# Patient Record
Sex: Male | Born: 1955 | Race: White | Hispanic: No | Smoking: Never smoker
Health system: Southern US, Community
[De-identification: ages and names within clinical notes are randomized; demographics above are authoritative.]

## PROBLEM LIST (undated history)

## (undated) DIAGNOSIS — F329 Major depressive disorder, single episode, unspecified: Secondary | ICD-10-CM

## (undated) DIAGNOSIS — K5730 Diverticulosis of large intestine without perforation or abscess without bleeding: Secondary | ICD-10-CM

## (undated) DIAGNOSIS — M545 Low back pain: Secondary | ICD-10-CM

## (undated) DIAGNOSIS — E785 Hyperlipidemia, unspecified: Secondary | ICD-10-CM

## (undated) HISTORY — DX: Low back pain: M54.5

## (undated) HISTORY — DX: Diverticulosis of large intestine without perforation or abscess without bleeding: K57.30

## (undated) HISTORY — DX: Hyperlipidemia, unspecified: E78.5

## (undated) HISTORY — DX: Major depressive disorder, single episode, unspecified: F32.9

---

## 1995-09-05 DIAGNOSIS — M545 Low back pain, unspecified: Secondary | ICD-10-CM

## 1995-09-05 HISTORY — PX: LUMBAR DISC SURGERY: SHX700

## 1995-09-05 HISTORY — DX: Low back pain, unspecified: M54.50

## 2004-09-20 ENCOUNTER — Ambulatory Visit: Payer: Self-pay | Admitting: Internal Medicine

## 2004-10-18 ENCOUNTER — Ambulatory Visit: Payer: Self-pay | Admitting: Internal Medicine

## 2005-02-16 ENCOUNTER — Ambulatory Visit: Payer: Self-pay | Admitting: Internal Medicine

## 2006-01-04 ENCOUNTER — Ambulatory Visit: Payer: Self-pay | Admitting: Internal Medicine

## 2006-02-07 ENCOUNTER — Ambulatory Visit: Payer: Self-pay | Admitting: Internal Medicine

## 2006-06-22 ENCOUNTER — Ambulatory Visit: Payer: Self-pay | Admitting: Internal Medicine

## 2006-06-22 LAB — CONVERTED CEMR LAB
Albumin: 4.1 g/dL (ref 3.5–5.2)
Alkaline Phosphatase: 65 units/L (ref 39–117)
Basophils Absolute: 0 10*3/uL (ref 0.0–0.1)
Chol/HDL Ratio, serum: 5.2
Cholesterol: 229 mg/dL (ref 0–200)
Creatinine, Ser: 0.9 mg/dL (ref 0.4–1.5)
GFR calc non Af Amer: 95 mL/min
HDL: 43.7 mg/dL (ref >39.0)
Hemoglobin: 16.1 g/dL (ref 13.0–17.0)
LDL DIRECT: 129.9 mg/dL
MCHC: 33.8 g/dL (ref 30.0–36.0)
MCV: 92.3 fL (ref 78.0–100.0)
Monocytes Relative: 4.1 % (ref 3.0–11.0)
Neutro Abs: 4.5 10*3/uL (ref 1.4–7.7)
Neutrophils Relative %: 68 % (ref 43.0–77.0)
PSA: 5.07 ng/mL — ABNORMAL HIGH (ref 0.10–4.00)
Potassium: 4.2 meq/L (ref 3.5–5.1)
RBC: 5.16 M/uL (ref 4.22–5.81)
RDW: 11.9 % (ref 11.5–14.6)
TSH: 1.16 microintl units/mL (ref 0.35–5.50)
VLDL: 65 mg/dL — ABNORMAL HIGH (ref 0–40)

## 2006-06-29 ENCOUNTER — Ambulatory Visit: Payer: Self-pay | Admitting: Internal Medicine

## 2006-08-30 ENCOUNTER — Ambulatory Visit: Payer: Self-pay | Admitting: Gastroenterology

## 2006-09-13 ENCOUNTER — Ambulatory Visit: Payer: Self-pay | Admitting: Internal Medicine

## 2006-10-30 ENCOUNTER — Ambulatory Visit: Payer: Self-pay | Admitting: Internal Medicine

## 2006-10-30 LAB — CONVERTED CEMR LAB
Cholesterol: 239 mg/dL (ref 0–200)
HDL: 44.3 mg/dL (ref >39.0)
PSA: 1.91 ng/mL (ref 0.10–4.00)
Triglycerides: 193 mg/dL — ABNORMAL HIGH (ref 0–149)

## 2006-12-10 ENCOUNTER — Ambulatory Visit: Payer: Self-pay | Admitting: Gastroenterology

## 2006-12-21 ENCOUNTER — Ambulatory Visit: Payer: Self-pay | Admitting: Gastroenterology

## 2006-12-21 ENCOUNTER — Encounter (INDEPENDENT_AMBULATORY_CARE_PROVIDER_SITE_OTHER): Payer: Self-pay | Admitting: Specialist

## 2007-02-18 ENCOUNTER — Ambulatory Visit: Payer: Self-pay | Admitting: Internal Medicine

## 2007-06-13 ENCOUNTER — Emergency Department (HOSPITAL_COMMUNITY): Admission: EM | Admit: 2007-06-13 | Discharge: 2007-06-13 | Payer: Self-pay | Admitting: Emergency Medicine

## 2007-07-01 ENCOUNTER — Ambulatory Visit: Payer: Self-pay | Admitting: Internal Medicine

## 2007-07-01 LAB — CONVERTED CEMR LAB
Blood in Urine, dipstick: NEGATIVE
Glucose, Urine, Semiquant: NEGATIVE
Protein, U semiquant: NEGATIVE
WBC Urine, dipstick: NEGATIVE

## 2007-07-08 DIAGNOSIS — K5730 Diverticulosis of large intestine without perforation or abscess without bleeding: Secondary | ICD-10-CM | POA: Insufficient documentation

## 2007-07-08 DIAGNOSIS — E785 Hyperlipidemia, unspecified: Secondary | ICD-10-CM | POA: Insufficient documentation

## 2007-07-08 DIAGNOSIS — M51379 Other intervertebral disc degeneration, lumbosacral region without mention of lumbar back pain or lower extremity pain: Secondary | ICD-10-CM | POA: Insufficient documentation

## 2007-07-08 DIAGNOSIS — M5137 Other intervertebral disc degeneration, lumbosacral region: Secondary | ICD-10-CM

## 2007-07-08 DIAGNOSIS — F329 Major depressive disorder, single episode, unspecified: Secondary | ICD-10-CM

## 2007-07-08 DIAGNOSIS — F32A Depression, unspecified: Secondary | ICD-10-CM | POA: Insufficient documentation

## 2007-07-08 DIAGNOSIS — K644 Residual hemorrhoidal skin tags: Secondary | ICD-10-CM | POA: Insufficient documentation

## 2007-07-08 LAB — CONVERTED CEMR LAB
ALT: 16 units/L (ref 0–53)
AST: 16 units/L (ref 0–37)
Basophils Absolute: 0 10*3/uL (ref 0.0–0.1)
Basophils Relative: 0.6 % (ref 0.0–1.0)
CO2: 28 meq/L (ref 19–32)
Calcium: 9.5 mg/dL (ref 8.4–10.5)
Chloride: 107 meq/L (ref 96–112)
Cholesterol: 212 mg/dL (ref 0–200)
Eosinophils Absolute: 0.1 10*3/uL (ref 0.0–0.6)
GFR calc Af Amer: 102 mL/min
Glucose, Bld: 105 mg/dL — ABNORMAL HIGH (ref 70–99)
HCT: 42 % (ref 39.0–52.0)
Hemoglobin: 15 g/dL (ref 13.0–17.0)
Lymphocytes Relative: 24.9 % (ref 12.0–46.0)
MCHC: 35.7 g/dL (ref 30.0–36.0)
Monocytes Absolute: 0.4 10*3/uL (ref 0.2–0.7)
Monocytes Relative: 4.9 % (ref 3.0–11.0)
Platelets: 240 10*3/uL (ref 150–400)
Sodium: 142 meq/L (ref 135–145)
Total Bilirubin: 0.5 mg/dL (ref 0.3–1.2)
Total CHOL/HDL Ratio: 4.8
VLDL: 29 mg/dL (ref 0–40)

## 2007-07-24 ENCOUNTER — Telehealth (INDEPENDENT_AMBULATORY_CARE_PROVIDER_SITE_OTHER): Payer: Self-pay | Admitting: *Deleted

## 2007-08-26 ENCOUNTER — Ambulatory Visit: Payer: Self-pay | Admitting: Internal Medicine

## 2008-01-03 ENCOUNTER — Emergency Department (HOSPITAL_COMMUNITY): Admission: EM | Admit: 2008-01-03 | Discharge: 2008-01-03 | Payer: Self-pay | Admitting: Emergency Medicine

## 2009-03-24 ENCOUNTER — Telehealth: Payer: Self-pay | Admitting: Internal Medicine

## 2009-04-28 ENCOUNTER — Ambulatory Visit: Payer: Self-pay | Admitting: Internal Medicine

## 2009-04-28 LAB — CONVERTED CEMR LAB
BUN: 8 mg/dL (ref 6–23)
CO2: 29 meq/L (ref 19–32)
Chloride: 107 meq/L (ref 96–112)
Creatinine, Ser: 1 mg/dL (ref 0.4–1.5)
Glucose, Bld: 137 mg/dL — ABNORMAL HIGH (ref 70–99)
HDL: 50.6 mg/dL (ref >39.00)
Potassium: 5.9 meq/L — ABNORMAL HIGH (ref 3.5–5.1)
Sodium: 145 meq/L (ref 135–145)

## 2009-06-23 ENCOUNTER — Ambulatory Visit: Payer: Self-pay | Admitting: Internal Medicine

## 2009-06-23 LAB — CONVERTED CEMR LAB: Triglycerides: 249 mg/dL — ABNORMAL HIGH (ref 0.0–149.0)

## 2009-09-24 ENCOUNTER — Ambulatory Visit: Payer: Self-pay | Admitting: Internal Medicine

## 2009-11-16 ENCOUNTER — Telehealth: Payer: Self-pay | Admitting: Internal Medicine

## 2009-12-10 ENCOUNTER — Telehealth: Payer: Self-pay | Admitting: Internal Medicine

## 2010-01-07 ENCOUNTER — Ambulatory Visit: Payer: Self-pay | Admitting: Internal Medicine

## 2010-01-07 LAB — CONVERTED CEMR LAB
Cholesterol, target level: 200 mg/dL
HDL goal, serum: 40 mg/dL

## 2010-02-17 ENCOUNTER — Telehealth: Payer: Self-pay | Admitting: Internal Medicine

## 2010-04-18 ENCOUNTER — Ambulatory Visit: Payer: Self-pay | Admitting: Internal Medicine

## 2010-08-17 ENCOUNTER — Ambulatory Visit: Payer: Self-pay | Admitting: Internal Medicine

## 2010-08-23 DIAGNOSIS — S139XXA Sprain of joints and ligaments of unspecified parts of neck, initial encounter: Secondary | ICD-10-CM

## 2010-10-06 NOTE — Progress Notes (Signed)
Summary: samples  Phone Note Call from Patient   Caller: Patient Call For: Stacie Glaze MD Summary of Call: # 28 samples of Cymbalta given to pt until next appt. Initial call taken by: Lynann Beaver CMA,  December 10, 2009 12:59 PM

## 2010-10-06 NOTE — Assessment & Plan Note (Signed)
Summary: roa/bmw   Vital Signs:  Patient profile:   55 year old male Height:      69 inches Weight:      188 pounds BMI:     27.86 Temp:     98.2 degrees F oral Pulse rate:   72 / minute Resp:     14 per minute BP sitting:   130 / 80  (left arm)  Vitals Entered By: Willy Eddy, LPN (April 18, 2010 8:22 AM) CC: roa-, Lipid Management, Depression Is Patient Diabetic? No   Primary Care Provider:  Stacie Glaze MD  CC:  roa-, Lipid Management, and Depression.  History of Present Illness: pt had a rough period two weeks ago and relapse ( girl friend was using)  checked into a detox center presented to dicuss this and refill meds needs help with medications  discssion of mood and need for samples  Depression History:      Positive alarm features for depression include psychomotor retardation, fatigue (loss of energy), feelings of worthlessness (guilt), and impaired concentration (indecisiveness).        Psychosocial stress factors include a recent traumatic event and major life changes.  Risk factors for depression include a family history of alcoholism, a personal history of depression, and a personal history of alcoholism.         Lipid Management History:      Positive NCEP/ATP III risk factors include male age 52 years old or older.  Negative NCEP/ATP III risk factors include HDL cholesterol greater than 60, non-tobacco-user status, no ASHD (atherosclerotic heart disease), no prior stroke/TIA, no peripheral vascular disease, and no history of aortic aneurysm.      Preventive Screening-Counseling & Management  Alcohol-Tobacco     Smoking Status: quit  Problems Prior to Update: 1)  Preventive Health Care  (ICD-V70.0) 2)  External Hemorrhoids  (ICD-455.3) 3)  Hyperlipidemia  (ICD-272.4) 4)  Diverticulosis, Colon  (ICD-562.10) 5)  Depression  (ICD-311) 6)  Disc Disease, Lumbar  (ICD-722.52)  Current Problems (verified): 1)  Preventive Health Care   (ICD-V70.0) 2)  External Hemorrhoids  (ICD-455.3) 3)  Hyperlipidemia  (ICD-272.4) 4)  Diverticulosis, Colon  (ICD-562.10) 5)  Depression  (ICD-311) 6)  Disc Disease, Lumbar  (ICD-722.52)  Medications Prior to Update: 1)  Pravachol 40 Mg Tabs (Pravastatin Sodium) .Marland Kitchen.. 1 Once Daily 2)  Cymbalta 60 Mg Cpep (Duloxetine Hcl) .... One By Mouth Daily 3)  Hydrocodone-Acetaminophen 5-500 Mg Tabs (Hydrocodone-Acetaminophen) .... One By Mouth Q 12 Hours As Needed May Fill Every 30 Days  Current Medications (verified): 1)  Pravachol 40 Mg Tabs (Pravastatin Sodium) .Marland Kitchen.. 1 Once Daily 2)  Cymbalta 60 Mg Cpep (Duloxetine Hcl) .... One By Mouth Daily 3)  Hydrocodone-Acetaminophen 5-500 Mg Tabs (Hydrocodone-Acetaminophen) .... One By Mouth Q 12 Hours As Needed May Fill Every 30 Days  Allergies (verified): No Known Drug Allergies  Past History:  Family History: Last updated: 09/24/2009 father  Family History Lung cancer mother copd  Social History: Last updated: 09/24/2009 Divorced Former Smoker Alcohol use-yes  Risk Factors: Smoking Status: quit (04/18/2010)  Past medical, surgical, family and social histories (including risk factors) reviewed, and no changes noted (except as noted below).  Past Medical History: Reviewed history from 07/08/2007 and no changes required. depression Diverticulosis, colon Hyperlipidemia  Family History: Reviewed history from 09/24/2009 and no changes required. father  Family History Lung cancer mother copd  Social History: Reviewed history from 09/24/2009 and no changes required. Divorced Former Smoker Alcohol use-yes  Review of Systems  The patient denies anorexia, fever, weight loss, weight gain, vision loss, decreased hearing, hoarseness, chest pain, syncope, dyspnea on exertion, peripheral edema, prolonged cough, headaches, hemoptysis, abdominal pain, melena, hematochezia, severe indigestion/heartburn, hematuria, incontinence, genital  sores, muscle weakness, suspicious skin lesions, transient blindness, difficulty walking, depression, unusual weight change, abnormal bleeding, enlarged lymph nodes, angioedema, and breast masses.    Physical Exam  General:  Well-developed,well-nourished,in no acute distress; alert,appropriate and cooperative throughout examination Head:  male-pattern balding.   Eyes:  pupils equal and pupils round.   Ears:  R ear normal and L ear normal.   Nose:  External nasal examination shows no deformity or inflammation. Nasal mucosa are pink and moist without lesions or exudates. Mouth:  Oral mucosa and oropharynx without lesions or exudates.  Teeth in good repair. Neck:  No deformities, masses, or tenderness noted. Lungs:  normal respiratory effort and no wheezes.   Heart:  normal rate and regular rhythm.     Impression & Recommendations:  Problem # 1:  DEPRESSION (ICD-311) went to detox, currently going to AA and anger management  His updated medication list for this problem includes:    Cymbalta 60 Mg Cpep (Duloxetine hcl) ..... One by mouth daily samples given  Discussed treatment options, including trial of antidpressant medication. Will refer to behavioral health. Follow-up call in in 24-48 hours and recheck in 2 weeks, sooner as needed. Patient agrees to call if any worsening of symptoms or thoughts of doing harm arise. Verified that the patient has no suicidal ideation at this time.   Problem # 2:  HYPERLIPIDEMIA (ICD-272.4) Assessment: Deteriorated not complaint His updated medication list for this problem includes:    Pravachol 40 Mg Tabs (Pravastatin sodium) .Marland Kitchen... 1 once daily  Labs Reviewed: SGOT: 16 (07/01/2007)   SGPT: 16 (07/01/2007)  Lipid Goals: Chol Goal: 200 (01/07/2010)   HDL Goal: 40 (01/07/2010)   LDL Goal: 160 (01/07/2010)   TG Goal: 150 (01/07/2010)  Prior 10 Yr Risk Heart Disease: Not enough information (09/24/2009)   HDL:60.80 (06/23/2009), 50.60 (04/28/2009)   LDL:DEL (07/01/2007), DEL (10/30/2006)  Chol:272 (06/23/2009), 241 (04/28/2009)  Trig:249.0 (06/23/2009), 319.0 (04/28/2009)  Complete Medication List: 1)  Pravachol 40 Mg Tabs (Pravastatin sodium) .Marland Kitchen.. 1 once daily 2)  Cymbalta 60 Mg Cpep (Duloxetine hcl) .... One by mouth daily 3)  Hydrocodone-acetaminophen 5-500 Mg Tabs (Hydrocodone-acetaminophen) .... One by mouth q 12 hours as needed may fill every 30 days  Lipid Assessment/Plan:      Based on NCEP/ATP III, the patient's risk factor category is "0-1 risk factors".  The patient's lipid goals are as follows: Total cholesterol goal is 200; LDL cholesterol goal is 160; HDL cholesterol goal is 40; Triglyceride goal is 150.    Patient Instructions: 1)  Please schedule a follow-up appointment in 4 months. Prescriptions: HYDROCODONE-ACETAMINOPHEN 5-500 MG TABS (HYDROCODONE-ACETAMINOPHEN) one by mouth q 12 hours as needed may fill every 30 days  #45 x 1   Entered by:   Willy Eddy, LPN   Authorized by:   Stacie Glaze MD   Signed by:   Willy Eddy, LPN on 47/82/9562   Method used:   Print then Give to Patient   RxID:   [id number] PRAVACHOL 40 MG TABS (PRAVASTATIN SODIUM) 1 once daily  #30 x 6   Entered by:   Willy Eddy, LPN   Authorized by:   Stacie Glaze MD   Signed by:   Willy Eddy, LPN on  04/18/2010   Method used:   Print then Give to Patient   RxID:   [id number]

## 2010-10-06 NOTE — Assessment & Plan Note (Signed)
Summary: 4 MONTH ROV/NJR/pt rescd from bump//ccm---PT [facility] / RS   Vital Signs:  Patient profile:   55 year old male Height:      69 inches (175.26 cm) Weight:      198 pounds (90.00 kg) BMI:     29.35 O2 Sat:      96 % on Room air Temp:     98.7 degrees F (37.06 degrees C) oral Pulse rate:   82 / minute BP sitting:   130 / 88  (left arm) Cuff size:   regular  Vitals Entered By: Brenton Grills CMA Duncan Dull) (August 23, 2010 9:37 AM)  O2 Flow:  Room air CC: 4 month F/U/pain in neck, "popping" when turning head/aj, CHF Management Is Patient Diabetic? No   Primary Care Provider:  Stacie Glaze MD  CC:  4 month F/U/pain in neck, "popping" when turning head/aj, and CHF Management.  History of Present Illness: Has gained ten pound in three months has pain in his neck with popping sounds and pain in neck. was stiff but has improved has hx of facet arthritis mood is stable ETOH in remission for 4.5 months and attends AA weekly   Preventive Screening-Counseling & Management  Alcohol-Tobacco     Smoking Status: never     Tobacco Counseling: not indicated; no tobacco use  Problems Prior to Update: 1)  Cervical Muscle Strain  (ICD-847.0) 2)  Preventive Health Care  (ICD-V70.0) 3)  External Hemorrhoids  (ICD-455.3) 4)  Hyperlipidemia  (ICD-272.4) 5)  Diverticulosis, Colon  (ICD-562.10) 6)  Depression  (ICD-311) 7)  Disc Disease, Lumbar  (ICD-722.52)  Medications Prior to Update: 1)  Pravachol 40 Mg Tabs (Pravastatin Sodium) .Marland Kitchen.. 1 Once Daily 2)  Cymbalta 60 Mg Cpep (Duloxetine Hcl) .... One By Mouth Daily 3)  Hydrocodone-Acetaminophen 5-500 Mg Tabs (Hydrocodone-Acetaminophen) .... One By Mouth Q 12 Hours As Needed May Fill Every 30 Days  Current Medications (verified): 1)  Pravachol 40 Mg Tabs (Pravastatin Sodium) .Marland Kitchen.. 1 Once Daily 2)  Cymbalta 60 Mg Cpep (Duloxetine Hcl) .... One By Mouth Daily 3)  Hydrocodone-Acetaminophen 5-500 Mg Tabs (Hydrocodone-Acetaminophen) .... One  By Mouth Q 12 Hours As Needed May Fill Every 30 Days  Allergies (verified): No Known Drug Allergies  Past History:  Family History: Last updated: 09/24/2009 father  Family History Lung cancer mother copd  Social History: Last updated: 09/24/2009 Divorced Former Smoker Alcohol use-yes  Risk Factors: Smoking Status: never (08/23/2010)  Past medical, surgical, family and social histories (including risk factors) reviewed, and no changes noted (except as noted below).  Past Medical History: Reviewed history from 07/08/2007 and no changes required. depression Diverticulosis, colon Hyperlipidemia  Family History: Reviewed history from 09/24/2009 and no changes required. father  Family History Lung cancer mother copd  Social History: Reviewed history from 09/24/2009 and no changes required. Divorced Former Smoker Alcohol use-yes Smoking Status:  never  Review of Systems       The patient complains of weight loss.  The patient denies anorexia, fever, weight gain, vision loss, decreased hearing, hoarseness, chest pain, syncope, dyspnea on exertion, peripheral edema, prolonged cough, headaches, hemoptysis, abdominal pain, melena, hematochezia, severe indigestion/heartburn, hematuria, incontinence, genital sores, muscle weakness, suspicious skin lesions, transient blindness, difficulty walking, depression, unusual weight change, abnormal bleeding, enlarged lymph nodes, angioedema, breast masses, and testicular masses.    Physical Exam  General:  Well-developed,well-nourished,in no acute distress; alert,appropriate and cooperative throughout examination Head:  male-pattern balding.   Eyes:  pupils equal and pupils round.  Ears:  R ear normal and L ear normal.   Nose:  External nasal examination shows no deformity or inflammation. Nasal mucosa are pink and moist without lesions or exudates. Neck:  no masses, nuchal rigidity, and decreased ROM.   Lungs:  normal respiratory  effort and no wheezes.   Heart:  normal rate and regular rhythm.   Abdomen:  soft and non-tender.     Impression & Recommendations:  Problem # 1:  HYPERLIPIDEMIA (ICD-272.4)  His updated medication list for this problem includes:    Pravachol 40 Mg Tabs (Pravastatin sodium) .Marland Kitchen... 1 once daily  Labs Reviewed: SGOT: 16 (07/01/2007)   SGPT: 16 (07/01/2007)  Lipid Goals: Chol Goal: 200 (01/07/2010)   HDL Goal: 40 (01/07/2010)   LDL Goal: 160 (01/07/2010)   TG Goal: 150 (01/07/2010)  Prior 10 Yr Risk Heart Disease: Not enough information (09/24/2009)   HDL:60.80 (06/23/2009), 50.60 (04/28/2009)  LDL:DEL (07/01/2007), DEL (10/30/2006)  Chol:272 (06/23/2009), 241 (04/28/2009)  Trig:249.0 (06/23/2009), 319.0 (04/28/2009)  Problem # 2:  DEPRESSION (ICD-311)  His updated medication list for this problem includes:    Cymbalta 60 Mg Cpep (Duloxetine hcl) ..... One by mouth daily  Discussed treatment options, including trial of antidpressant medication. Will refer to behavioral health. Follow-up call in in 24-48 hours and recheck in 2 weeks, sooner as needed. Patient agrees to call if any worsening of symptoms or thoughts of doing harm arise. Verified that the patient has no suicidal ideation at this time.   Problem # 3:  CERVICAL MUSCLE STRAIN (ICD-847.0)  His updated medication list for this problem includes:    Hydrocodone-acetaminophen 5-500 Mg Tabs (Hydrocodone-acetaminophen) ..... One by mouth q 12 hours as needed may fill every 30 days    Cyclobenzaprine Hcl 10 Mg Tabs (Cyclobenzaprine hcl) ..... One by mouth three times a day prn  Discussed exercises and use of moist heat or cold and medication.   Complete Medication List: 1)  Pravachol 40 Mg Tabs (Pravastatin sodium) .Marland Kitchen.. 1 once daily 2)  Cymbalta 60 Mg Cpep (Duloxetine hcl) .... One by mouth daily 3)  Hydrocodone-acetaminophen 5-500 Mg Tabs (Hydrocodone-acetaminophen) .... One by mouth q 12 hours as needed may fill every 30  days 4)  Cyclobenzaprine Hcl 10 Mg Tabs (Cyclobenzaprine hcl) .... One by mouth three times a day prn  CHF Assessment/Plan:      The patient's current weight is 198 pounds.  His previous weight was 188 pounds.     Patient Instructions: 1)  Please schedule a follow-up appointment in 4 months. Prescriptions: CYCLOBENZAPRINE HCL 10 MG TABS (CYCLOBENZAPRINE HCL) one by mouth three times a day prn  #30 x 0   Entered and Authorized by:   Stacie Glaze MD   Signed by:   Stacie Glaze MD on 08/23/2010   Method used:   Electronically to        [facility] [id number]* (retail)       [address]       [place], [place]  [address]       Ph: [phone]       Fax: [phone]   RxID:   [id number]    Orders Added: 1)  Est. Patient Level II [24401]

## 2010-10-06 NOTE — Progress Notes (Signed)
Summary: samples please  Phone Note Call from Patient Call back at Home Phone [phone]   Caller: Patient Call For: Stacie Glaze MD Complaint: Breathing Problems, Urinary/GYN Problems Summary of Call: Pt is requesting more samples of cymbalta 60 mg  Initial call taken by: Heron Sabins,  February 17, 2010 4:14 PM  Follow-up for Phone Call        Pt called to check on status of Cymbalta samples. Pt is completely out. Pt would like to pick up samples today. Pls call asap.  Follow-up by: Lucy Antigua,  February 18, 2010 1:42 PM  Additional Follow-up for Phone Call Additional follow up Details #1::        samples are ready per bonnye. Additional Follow-up by: Warnell Forester,  February 21, 2010 1:33 PM

## 2010-10-06 NOTE — Progress Notes (Signed)
Summary: RX question  Phone Note Call from Patient   Caller: Patient Call For: Stacie Glaze MD Summary of Call: Pt needs a generic chol med that is less expensive that Fibrocor? Nicolette Bang [facility]) [phone] Initial call taken by: Lynann Beaver CMA,  November 16, 2009 12:31 PM Reason for Call: Need Referral Information  Follow-up for Phone Call        per    New/Updated Medications: PRAVACHOL 40 MG TABS (PRAVASTATIN SODIUM) 1 once daily Prescriptions: PRAVACHOL 40 MG TABS (PRAVASTATIN SODIUM) 1 once daily  #30 x 6   Entered by:   Willy Eddy, LPN   Authorized by:   Stacie Glaze MD   Signed by:   Willy Eddy, LPN on 09/81/1914   Method used:   Electronically to        [facility] (retail)       [address]       [place], [place]  [address]       Ph: [phone]       Fax: [phone]   RxID:   [id number]   Appended Document: RX question pt informed

## 2010-10-06 NOTE — Assessment & Plan Note (Signed)
Summary: 3 month rov/njr [facility] BMP/NJR   Vital Signs:  Patient profile:   55 year old male Height:      69 inches Weight:      194 pounds BMI:     28.75 Temp:     98.2 degrees F oral Pulse rate:   72 / minute Resp:     14 per minute BP sitting:   120 / 80  (left arm)  Vitals Entered By: Willy Eddy, LPN (Jan 08, 1323 12:26 PM) CC: roa- vimova didnt help back pain, Lipid Management   Primary Care Provider:  Stacie Glaze MD  CC:  roa- vimova didnt help back pain and Lipid Management.  History of Present Illness: follow up medications did not note much improvement in back pain with vimovo still rates pain as 8/10  Lipid Management History:      Positive NCEP/ATP III risk factors include male age 59 years old or older.  Negative NCEP/ATP III risk factors include HDL cholesterol greater than 60 and non-tobacco-user status.     Preventive Screening-Counseling & Management  Alcohol-Tobacco     Smoking Status: quit  Problems Prior to Update: 1)  Preventive Health Care  (ICD-V70.0) 2)  External Hemorrhoids  (ICD-455.3) 3)  Hyperlipidemia  (ICD-272.4) 4)  Diverticulosis, Colon  (ICD-562.10) 5)  Depression  (ICD-311) 6)  Disc Disease, Lumbar  (ICD-722.52)  Medications Prior to Update: 1)  Pravachol 40 Mg Tabs (Pravastatin Sodium) .Marland Kitchen.. 1 Once Daily 2)  Cymbalta 60 Mg Cpep (Duloxetine Hcl) .... One By Mouth Daily 3)  Vimovo 500-20 Mg Tbec (Naproxen-Esomeprazole) .... One By Mouth Daily  Current Medications (verified): 1)  Pravachol 40 Mg Tabs (Pravastatin Sodium) .Marland Kitchen.. 1 Once Daily 2)  Cymbalta 60 Mg Cpep (Duloxetine Hcl) .... One By Mouth Daily 3)  Hydrocodone-Acetaminophen 5-500 Mg Tabs (Hydrocodone-Acetaminophen) .... One By Mouth Q 12 Hours As Needed May Fill Every 30 Days  Allergies (verified): No Known Drug Allergies  Past History:  Family History: Last updated: 09/24/2009 father  Family History Lung cancer mother copd  Social History: Last updated:  09/24/2009 Divorced Former Smoker Alcohol use-yes  Risk Factors: Smoking Status: quit (01/07/2010)  Past medical, surgical, family and social histories (including risk factors) reviewed, and no changes noted (except as noted below).  Past Medical History: Reviewed history from 07/08/2007 and no changes required. depression Diverticulosis, colon Hyperlipidemia  Family History: Reviewed history from 09/24/2009 and no changes required. father  Family History Lung cancer mother copd  Social History: Reviewed history from 09/24/2009 and no changes required. Divorced Former Smoker Alcohol use-yes  Review of Systems  The patient denies anorexia, fever, weight loss, weight gain, vision loss, decreased hearing, hoarseness, chest pain, syncope, dyspnea on exertion, peripheral edema, prolonged cough, headaches, hemoptysis, abdominal pain, melena, hematochezia, severe indigestion/heartburn, hematuria, incontinence, genital sores, muscle weakness, suspicious skin lesions, transient blindness, difficulty walking, depression, unusual weight change, abnormal bleeding, enlarged lymph nodes, angioedema, breast masses, and testicular masses.    Physical Exam  General:  Well-developed,well-nourished,in no acute distress; alert,appropriate and cooperative throughout examination Eyes:  pupils equal and pupils round.   Nose:  External nasal examination shows no deformity or inflammation. Nasal mucosa are pink and moist without lesions or exudates. Mouth:  Oral mucosa and oropharynx without lesions or exudates.  Teeth in good repair. Neck:  No deformities, masses, or tenderness noted. Lungs:  normal respiratory effort and no wheezes.   Heart:  normal rate and regular rhythm.     Impression &  Recommendations:  Problem # 1:  HYPERLIPIDEMIA (ICD-272.4)  His updated medication list for this problem includes:    Pravachol 40 Mg Tabs (Pravastatin sodium) .Marland Kitchen... 1 once daily  Labs Reviewed: SGOT:  16 (07/01/2007)   SGPT: 16 (07/01/2007)  Prior 10 Yr Risk Heart Disease: Not enough information (09/24/2009)   HDL:60.80 (06/23/2009), 50.60 (04/28/2009)  LDL:DEL (07/01/2007), DEL (10/30/2006)  Chol:272 (06/23/2009), 241 (04/28/2009)  Trig:249.0 (06/23/2009), 319.0 (04/28/2009)  Problem # 2:  DEPRESSION (ICD-311)  His updated medication list for this problem includes:    Cymbalta 60 Mg Cpep (Duloxetine hcl) ..... One by mouth daily  Discussed treatment options, including trial of antidpressant medication. Will refer to behavioral health. Follow-up call in in 24-48 hours and recheck in 2 weeks, sooner as needed. Patient agrees to call if any worsening of symptoms or thoughts of doing harm arise. Verified that the patient has no suicidal ideation at this time.   Problem # 3:  DISC DISEASE, LUMBAR (ICD-722.52) hydrocodone   the vimovo did not help the back issues stiffness with pain radiating to the left leg  Complete Medication List: 1)  Pravachol 40 Mg Tabs (Pravastatin sodium) .Marland Kitchen.. 1 once daily 2)  Cymbalta 60 Mg Cpep (Duloxetine hcl) .... One by mouth daily 3)  Hydrocodone-acetaminophen 5-500 Mg Tabs (Hydrocodone-acetaminophen) .... One by mouth q 12 hours as needed may fill every 30 days  Lipid Assessment/Plan:      Based on NCEP/ATP III, the patient's risk factor category is "0-1 risk factors".  The patient's lipid goals are as follows: Total cholesterol goal is 200; LDL cholesterol goal is 160; HDL cholesterol goal is 40; Triglyceride goal is 150.    Patient Instructions: 1)  Please schedule a follow-up appointment in 3 months. Prescriptions: HYDROCODONE-ACETAMINOPHEN 5-500 MG TABS (HYDROCODONE-ACETAMINOPHEN) one by mouth q 12 hours as needed may fill every 30 days  #45 x 3   Entered and Authorized by:   Stacie Glaze MD   Signed by:   Stacie Glaze MD on 01/07/2010   Method used:   Print then Give to Patient   RxID:   [id number]

## 2010-10-06 NOTE — Assessment & Plan Note (Signed)
Summary: 3 month rov/njr   Vital Signs:  Patient profile:   55 year old male Height:      69 inches Weight:      198 pounds BMI:     29.35 Temp:     98.2 degrees F oral Pulse rate:   76 / minute Resp:     14 per minute BP sitting:   120 / 80  (left arm)  Vitals Entered By: Willy Eddy, LPN (September 24, 2009 1:39 PM) CC: eroa to discuss lipids from november, Depression, Hypertension Management   CC:  eroa to discuss lipids from november, Depression, and Hypertension Management.  History of Present Illness: The pt shoveled snow several weeks ago and has had increased back pain  and is requesting hydrocodone otherwise he feels better his alcohol use is "undercontrol" but he is still drinking the cymbalta has controlled his depression he has gained weight no smoking   Depression History:      The patient denies a depressed mood most of the day and a diminished interest in his usual daily activities.  Positive alarm features for depression include significant weight gain.  However, he denies significant weight loss, insomnia, hypersomnia, psychomotor agitation, psychomotor retardation, fatigue (loss of energy), feelings of worthlessness (guilt), impaired concentration (indecisiveness), and recurrent thoughts of death or suicide.  The patient denies symptoms of a manic disorder including persistently & abnormally elevated mood, abnormally & persistently irritable mood, less need for sleep, talkative or feels need to keep talking, distractibility, flight of ideas, increase in goal-directed activity, psychomotor agitation, inflated self-esteem or grandiosity, excessive buying sprees, excessive sexual indiscretions, and excessive foolish business investments.        Suicide risk questions reveal that he wishes that he were dead and he has thought about ending his life.  The patient denies that he feels like life is not worth living.         Hypertension History:      Positive major  cardiovascular risk factors include male age 25 years old or older and hyperlipidemia.  Negative major cardiovascular risk factors include non-tobacco-user status.     Preventive Screening-Counseling & Management  Alcohol-Tobacco     Smoking Status: quit  Problems Prior to Update: 1)  Preventive Health Care  (ICD-V70.0) 2)  External Hemorrhoids  (ICD-455.3) 3)  Hyperlipidemia  (ICD-272.4) 4)  Diverticulosis, Colon  (ICD-562.10) 5)  Depression  (ICD-311) 6)  Disc Disease, Lumbar  (ICD-722.52)  Medications Prior to Update: 1)  Fibricor 105 Mg Tabs (Fenofibric Acid) .... One By Mouth Daily 2)  Cymbalta 60 Mg Cpep (Duloxetine Hcl) .... One By Mouth Daily  Current Medications (verified): 1)  Fibricor 105 Mg Tabs (Fenofibric Acid) .... One By Mouth Daily 2)  Cymbalta 60 Mg Cpep (Duloxetine Hcl) .... One By Mouth Daily 3)  Vimovo 500-20 Mg Tbec (Naproxen-Esomeprazole) .... One By Mouth Daily  Allergies (verified): No Known Drug Allergies  Past History:  Past Medical History: Last updated: 07/08/2007 depression Diverticulosis, colon Hyperlipidemia  Family History: Reviewed history and no changes required. father  Family History Lung cancer mother copd  Social History: Reviewed history and no changes required. Divorced Former Smoker Alcohol use-yes Smoking Status:  quit  Review of Systems  The patient denies anorexia, fever, weight loss, weight gain, vision loss, decreased hearing, hoarseness, chest pain, syncope, dyspnea on exertion, peripheral edema, prolonged cough, headaches, hemoptysis, abdominal pain, melena, hematochezia, severe indigestion/heartburn, hematuria, incontinence, genital sores, muscle weakness, suspicious skin lesions, transient blindness, difficulty walking,  depression, unusual weight change, abnormal bleeding, enlarged lymph nodes, angioedema, and breast masses.    Physical Exam  General:  Well-developed,well-nourished,in no acute distress;  alert,appropriate and cooperative throughout examination Neck:  No deformities, masses, or tenderness noted. Lungs:  normal respiratory effort and no wheezes.   Heart:  normal rate and regular rhythm.   Abdomen:  soft and non-tender.   Msk:  normal ROM, no joint tenderness, and no joint swelling.   Extremities:  trace left pedal edema and trace right pedal edema.   Neurologic:  alert & oriented X3 and gait normal.     Impression & Recommendations:  Problem # 1:  DISC DISEASE, LUMBAR (ICD-722.52)  Problem # 2:  HYPERLIPIDEMIA (ICD-272.4) Assessment: Unchanged  His updated medication list for this problem includes:    Fibricor 105 Mg Tabs (Fenofibric acid) ..... One by mouth daily  Labs Reviewed: SGOT: 16 (07/01/2007)   SGPT: 16 (07/01/2007)   HDL:60.80 (06/23/2009), 50.60 (04/28/2009)  LDL:DEL (07/01/2007), DEL (10/30/2006)  Chol:272 (06/23/2009), 241 (04/28/2009)  Trig:249.0 (06/23/2009), 319.0 (04/28/2009)  Complete Medication List: 1)  Fibricor 105 Mg Tabs (Fenofibric acid) .... One by mouth daily 2)  Cymbalta 60 Mg Cpep (Duloxetine hcl) .... One by mouth daily 3)  Vimovo 500-20 Mg Tbec (Naproxen-esomeprazole) .... One by mouth daily  Hypertension Assessment/Plan:      The patient's hypertensive risk group is category B: At least one risk factor (excluding diabetes) with no target organ damage.  Today's blood pressure is 120/80.  His blood pressure goal is < 140/90.   Patient Instructions: 1)  Please schedule a follow-up appointment in 3 months. Prescriptions: FIBRICOR 105 MG TABS (FENOFIBRIC ACID) one by mouth daily  #30 x 11   Entered and Authorized by:   Stacie Glaze MD   Signed by:   Stacie Glaze MD on 09/24/2009   Method used:   Electronically to        [facility] (retail)       [address]       [place], [place]  [address]       Ph: [phone]       Fax: [phone]   RxID:   [id number] VIMOVO 500-20 MG TBEC  (NAPROXEN-ESOMEPRAZOLE) one by mouth daily  #30 x 1   Entered and Authorized by:   Stacie Glaze MD   Signed by:   Stacie Glaze MD on 09/24/2009   Method used:   Electronically to        [facility] (retail)       [address]       [place], [place]  [address]       Ph: [phone]       Fax: [phone]   RxID:   [id number] VIMOVO 500-20 MG TBEC (NAPROXEN-ESOMEPRAZOLE) one by mouth daily  #30 x 0   Entered and Authorized by:   Stacie Glaze MD   Signed by:   Stacie Glaze MD on 09/24/2009   Method used:   Electronically to        [facility] [phone][facility] (retail)       [address]       [place], [place]  [address]       Ph: [phone]       Fax: [phone]   RxID:  [id number]  

## 2010-12-14 ENCOUNTER — Encounter: Payer: Self-pay | Admitting: Internal Medicine

## 2010-12-21 ENCOUNTER — Ambulatory Visit: Payer: Self-pay | Admitting: Internal Medicine

## 2011-01-27 ENCOUNTER — Ambulatory Visit: Payer: Self-pay | Admitting: Internal Medicine

## 2011-02-01 ENCOUNTER — Ambulatory Visit (INDEPENDENT_AMBULATORY_CARE_PROVIDER_SITE_OTHER): Payer: Self-pay | Admitting: Internal Medicine

## 2011-02-01 ENCOUNTER — Encounter: Payer: Self-pay | Admitting: Internal Medicine

## 2011-02-01 ENCOUNTER — Other Ambulatory Visit: Payer: Self-pay | Admitting: *Deleted

## 2011-02-01 VITALS — BP 120/70 | HR 72 | Temp 98.2°F | Resp 16 | Ht 70.0 in | Wt 202.0 lb

## 2011-02-01 DIAGNOSIS — F1021 Alcohol dependence, in remission: Secondary | ICD-10-CM | POA: Insufficient documentation

## 2011-02-01 DIAGNOSIS — F339 Major depressive disorder, recurrent, unspecified: Secondary | ICD-10-CM

## 2011-02-01 DIAGNOSIS — S139XXA Sprain of joints and ligaments of unspecified parts of neck, initial encounter: Secondary | ICD-10-CM

## 2011-02-01 DIAGNOSIS — F1010 Alcohol abuse, uncomplicated: Secondary | ICD-10-CM

## 2011-02-01 MED ORDER — TRAMADOL HCL 50 MG PO TABS: 50.0000 mg | ORAL_TABLET | Freq: Four times a day (QID) | ORAL | Status: DC | PRN

## 2011-02-01 MED ORDER — DULOXETINE HCL 60 MG PO CPEP: 60.0000 mg | ORAL_CAPSULE | Freq: Every day | ORAL | Status: DC

## 2011-02-01 MED ORDER — CYCLOBENZAPRINE HCL 5 MG PO TABS: 5.0000 mg | ORAL_TABLET | Freq: Two times a day (BID) | ORAL | Status: DC | PRN

## 2011-02-01 MED ORDER — PRAVASTATIN SODIUM 40 MG PO TABS: 40.0000 mg | ORAL_TABLET | Freq: Every day | ORAL | Status: DC

## 2011-02-01 MED ORDER — TRAZODONE HCL 100 MG PO TABS: 100.0000 mg | ORAL_TABLET | Freq: Every day | ORAL | Status: DC

## 2011-02-01 NOTE — Assessment & Plan Note (Signed)
To avoid narcotic use we will use tramadol

## 2011-02-01 NOTE — Progress Notes (Signed)
  Subjective:    Patient ID: Marvin Clark, male    DOB: 1955-12-13, 55 y.o.   MRN: [id number]  HPI Patient has a history of alcohol abuse she went through therapy in February for 3 days and patient he is sober for 4 months.  His chronic low back pain for which he had taken a narcotic he is desiring no narcotic medications during his recovery.  We discussed the use of Cymbalta for chronic back pain we'll continue that medication and we discussed the role of exercise.  We will use Ultram when necessary severe pain and discussed the limited use of Flexeril on the 5 mg dosage.    Review of Systems  Constitutional: Negative for fever and fatigue.  HENT: Negative for hearing loss, congestion, neck pain and postnasal drip.   Eyes: Negative for discharge, redness and visual disturbance.  Respiratory: Negative for cough, shortness of breath and wheezing.   Cardiovascular: Negative for leg swelling.  Gastrointestinal: Negative for abdominal pain, constipation and abdominal distention.  Genitourinary: Negative for urgency and frequency.  Musculoskeletal: Negative for joint swelling and arthralgias.  Skin: Negative for color change and rash.  Neurological: Negative for weakness and light-headedness.  Hematological: Negative for adenopathy.  Psychiatric/Behavioral: Negative for behavioral problems.       Objective:   Physical Exam  Constitutional: He appears well-developed and well-nourished.  HENT:  Head: Normocephalic and atraumatic.  Eyes: Conjunctivae are normal. Pupils are equal, round, and reactive to light.  Neck: Normal range of motion. Neck supple.  Cardiovascular: Normal rate and regular rhythm.   Pulmonary/Chest: Effort normal and breath sounds normal.  Abdominal: Soft. Bowel sounds are normal.          Assessment & Plan:  Pain control Flexeril and Ultram. Avoid use of narcotics his chart should be labeled that no narcotics will be called in.  Continued sobriety  encouraged attendance at AA should be mandatory

## 2011-02-01 NOTE — Assessment & Plan Note (Signed)
Patient participating in AA has been sober for 4 months is concerned about medications containing opioids and back pain

## 2011-02-03 ENCOUNTER — Ambulatory Visit (INDEPENDENT_AMBULATORY_CARE_PROVIDER_SITE_OTHER)
Admission: RE | Admit: 2011-02-03 | Discharge: 2011-02-03 | Disposition: A | Discharge status: Home / Self Care | Payer: Self-pay | Source: Ambulatory Visit | Attending: Internal Medicine | Admitting: Internal Medicine

## 2011-02-03 DIAGNOSIS — S139XXA Sprain of joints and ligaments of unspecified parts of neck, initial encounter: Secondary | ICD-10-CM

## 2011-02-06 ENCOUNTER — Telehealth: Payer: Self-pay | Admitting: *Deleted

## 2011-02-06 NOTE — Telephone Encounter (Signed)
Informed about xray- neck exercise book out fron and samples of cymbalta

## 2011-02-06 NOTE — Telephone Encounter (Signed)
Pt requesting xray results

## 2011-05-03 ENCOUNTER — Ambulatory Visit (INDEPENDENT_AMBULATORY_CARE_PROVIDER_SITE_OTHER): Payer: Self-pay | Admitting: Internal Medicine

## 2011-05-03 ENCOUNTER — Encounter: Payer: Self-pay | Admitting: Internal Medicine

## 2011-05-03 DIAGNOSIS — G4730 Sleep apnea, unspecified: Secondary | ICD-10-CM

## 2011-05-03 DIAGNOSIS — F329 Major depressive disorder, single episode, unspecified: Secondary | ICD-10-CM

## 2011-05-03 DIAGNOSIS — M5137 Other intervertebral disc degeneration, lumbosacral region: Secondary | ICD-10-CM

## 2011-05-03 NOTE — Progress Notes (Signed)
  Subjective:    Patient ID: Marvin Clark, male    DOB: 08-10-1956, 55 y.o.   MRN: [id number]  HPI This is a 55 year old white male followed for depression he is on Cymbalta 60 mg daily and is doing well.  He has questions about sleep apnea Marvin Clark snores loudly at night and is somewhat sleepy during the day   Review of Systems  Constitutional: Negative for fever and fatigue.  HENT: Negative for hearing loss, congestion, neck pain and postnasal drip.   Eyes: Negative for discharge, redness and visual disturbance.  Respiratory: Negative for cough, shortness of breath and wheezing.   Cardiovascular: Negative for leg swelling.  Gastrointestinal: Negative for abdominal pain, constipation and abdominal distention.  Genitourinary: Negative for urgency and frequency.  Musculoskeletal: Negative for joint swelling and arthralgias.  Skin: Negative for color change and rash.  Neurological: Negative for weakness and light-headedness.  Hematological: Negative for adenopathy.  Psychiatric/Behavioral: Negative for behavioral problems.       Objective:   Physical Exam  Constitutional: He appears well-developed and well-nourished.  HENT:  Head: Normocephalic and atraumatic.  Eyes: Conjunctivae are normal. Pupils are equal, round, and reactive to light.  Neck: Normal range of motion. Neck supple.  Cardiovascular: Normal rate and regular rhythm.   Pulmonary/Chest: Effort normal and breath sounds normal.  Abdominal: Soft. Bowel sounds are normal.          Assessment & Plan:  His depression is stable on the Cymbalta will continue his medications concerning sleep apnea he has 2 of the criteria for sleep apnea we discussed using Breathe Right nasal strips nasal saline prior to bed and weight loss as an initial intervention and we'll monitor him and considering for a full sleep study if his symptoms persist

## 2011-05-20 ENCOUNTER — Other Ambulatory Visit: Payer: Self-pay | Admitting: Internal Medicine

## 2011-06-07 ENCOUNTER — Other Ambulatory Visit: Payer: Self-pay | Admitting: Internal Medicine

## 2011-06-16 ENCOUNTER — Other Ambulatory Visit: Payer: Self-pay | Admitting: Internal Medicine

## 2011-06-20 ENCOUNTER — Other Ambulatory Visit: Payer: Self-pay | Admitting: Internal Medicine

## 2011-07-17 ENCOUNTER — Telehealth: Payer: Self-pay | Admitting: *Deleted

## 2011-07-17 NOTE — Telephone Encounter (Signed)
Can pt have Cymbalta samples 60 mg>?

## 2011-07-17 NOTE — Telephone Encounter (Signed)
Yes per dr Lovell Sheehan

## 2011-07-18 ENCOUNTER — Other Ambulatory Visit: Payer: Self-pay | Admitting: Internal Medicine

## 2011-07-19 ENCOUNTER — Other Ambulatory Visit: Payer: Self-pay | Admitting: Internal Medicine

## 2011-07-19 NOTE — Telephone Encounter (Signed)
Pt was informed 11-12 they were ready for pick up

## 2011-07-19 NOTE — Telephone Encounter (Signed)
Pt would like samples of cymbalta 60mg .

## 2011-08-03 ENCOUNTER — Telehealth: Payer: Self-pay | Admitting: Internal Medicine

## 2011-08-03 ENCOUNTER — Ambulatory Visit: Payer: Self-pay | Admitting: Internal Medicine

## 2011-08-03 NOTE — Telephone Encounter (Signed)
Requesting more samples of Cymbalta 60mg  or 30 mg samples until his 09-12-2011 appt with Dr Lovell Sheehan.

## 2011-08-03 NOTE — Telephone Encounter (Signed)
Patient is aware 

## 2011-08-03 NOTE — Telephone Encounter (Signed)
Please let pt know samples are out front -thanks

## 2011-08-14 ENCOUNTER — Other Ambulatory Visit: Payer: Self-pay | Admitting: Internal Medicine

## 2011-08-28 ENCOUNTER — Other Ambulatory Visit: Payer: Self-pay | Admitting: Internal Medicine

## 2011-09-05 DIAGNOSIS — E785 Hyperlipidemia, unspecified: Secondary | ICD-10-CM

## 2011-09-05 DIAGNOSIS — F32A Depression, unspecified: Secondary | ICD-10-CM

## 2011-09-05 HISTORY — DX: Hyperlipidemia, unspecified: E78.5

## 2011-09-05 HISTORY — DX: Depression, unspecified: F32.A

## 2011-09-12 ENCOUNTER — Ambulatory Visit (INDEPENDENT_AMBULATORY_CARE_PROVIDER_SITE_OTHER): Payer: Self-pay | Admitting: Internal Medicine

## 2011-09-12 ENCOUNTER — Encounter: Payer: Self-pay | Admitting: Internal Medicine

## 2011-09-12 VITALS — BP 144/84 | HR 76 | Temp 98.3°F | Resp 16 | Ht 70.0 in | Wt 202.0 lb

## 2011-09-12 DIAGNOSIS — T887XXA Unspecified adverse effect of drug or medicament, initial encounter: Secondary | ICD-10-CM

## 2011-09-12 NOTE — Progress Notes (Signed)
  Subjective:    Patient ID: Marvin Clark, male    DOB: December 06, 1955, 56 y.o.   MRN: [id number]  HPI Follow up of medications   Review of Systems  Constitutional: Negative for fever and fatigue.  HENT: Negative for hearing loss, congestion, neck pain and postnasal drip.   Eyes: Negative for discharge, redness and visual disturbance.  Respiratory: Negative for cough, shortness of breath and wheezing.   Cardiovascular: Negative for leg swelling.  Gastrointestinal: Negative for abdominal pain, constipation and abdominal distention.  Genitourinary: Negative for urgency and frequency.  Musculoskeletal: Negative for joint swelling and arthralgias.  Skin: Negative for color change and rash.  Neurological: Negative for weakness and light-headedness.  Hematological: Negative for adenopathy.  Psychiatric/Behavioral: Negative for behavioral problems.   Past Medical History  Diagnosis Date  . Depression   . Diverticulosis of colon   . Hyperlipidemia     History   Social History  . Marital Status: Divorced    Spouse Name: N/A    Number of Children: N/A  . Years of Education: N/A   Occupational History  . Not on file.   Social History Main Topics  . Smoking status: Never Smoker   . Smokeless tobacco: Not on file  . Alcohol Use: No  . Drug Use: No  . Sexually Active: Yes   Other Topics Concern  . Not on file   Social History Narrative  . No narrative on file    No past surgical history on file.  Family History  Problem Relation Age of Onset  . COPD Mother   . Cancer Father     No Known Allergies  Current Outpatient Prescriptions on File Prior to Visit  Medication Sig Dispense Refill  . cyclobenzaprine (FLEXERIL) 5 MG tablet TAKE ONE TABLET BY MOUTH TWICE DAILY AS NEEDED FOR MUSCLE SPASM  30 tablet  1  . DULoxetine (CYMBALTA) 60 MG capsule Take 1 capsule (60 mg total) by mouth daily.  30 capsule  3  . pravastatin (PRAVACHOL) 40 MG tablet Take 1 tablet (40 mg  total) by mouth daily.  30 tablet  11  . traMADol (ULTRAM) 50 MG tablet Take 50 mg by mouth every 6 (six) hours as needed.        . traZODone (DESYREL) 100 MG tablet TAKE ONE TABLET BY MOUTH AT BEDTIME  30 tablet  6    BP 144/84  Pulse 76  Temp 98.3 F (36.8 C)  Resp 16  Ht 5\' 10"  (1.778 m)  Wt 202 lb (91.627 kg)  BMI 28.98 kg/m2       Objective:   Physical Exam  Nursing note and vitals reviewed. Constitutional: He appears well-developed and well-nourished.  HENT:  Head: Normocephalic and atraumatic.  Eyes: Conjunctivae are normal. Pupils are equal, round, and reactive to light.  Neck: Normal range of motion. Neck supple.  Cardiovascular: Normal rate and regular rhythm.   Pulmonary/Chest: Effort normal and breath sounds normal.  Abdominal: Soft. Bowel sounds are normal.          Assessment & Plan:  Patient is stable on the current medications we'll continue the Cymbalta for treatment of the depression and continue all of his other medications include his medicine for cholesterol control he is hopeful that he will have health insurance within the next 3 months and plans to have a physical at that time so we will defer blood work until after that date

## 2011-09-12 NOTE — Patient Instructions (Signed)
Call our office by Friday for samples of Cymbalta

## 2011-09-14 ENCOUNTER — Other Ambulatory Visit: Payer: Self-pay | Admitting: Internal Medicine

## 2011-09-14 ENCOUNTER — Telehealth: Payer: Self-pay | Admitting: *Deleted

## 2011-09-14 NOTE — Telephone Encounter (Signed)
Pt is asking for samples of Cymbalta 60 mg.

## 2011-09-14 NOTE — Telephone Encounter (Signed)
Debby gave pt #21 samples.thanks.

## 2011-10-02 ENCOUNTER — Other Ambulatory Visit: Payer: Self-pay | Admitting: Internal Medicine

## 2011-11-07 ENCOUNTER — Encounter: Payer: Self-pay | Admitting: Gastroenterology

## 2011-11-23 ENCOUNTER — Telehealth: Payer: Self-pay | Admitting: *Deleted

## 2011-11-23 NOTE — Telephone Encounter (Signed)
Pt. Would like Cymbalta 60 mg. Samples if Dr. Lovell Sheehan has any.

## 2011-11-24 NOTE — Telephone Encounter (Signed)
Samples given.  

## 2011-11-24 NOTE — Telephone Encounter (Signed)
Cymbalta 60 mg #28 samples left up front for pt.

## 2011-12-11 ENCOUNTER — Ambulatory Visit (INDEPENDENT_AMBULATORY_CARE_PROVIDER_SITE_OTHER): Payer: Self-pay | Admitting: Internal Medicine

## 2011-12-11 ENCOUNTER — Encounter: Payer: Self-pay | Admitting: Internal Medicine

## 2011-12-11 VITALS — BP 136/82 | HR 76 | Temp 98.2°F | Resp 16 | Ht 69.0 in | Wt 210.0 lb

## 2011-12-11 DIAGNOSIS — R4589 Other symptoms and signs involving emotional state: Secondary | ICD-10-CM

## 2011-12-11 DIAGNOSIS — F329 Major depressive disorder, single episode, unspecified: Secondary | ICD-10-CM

## 2011-12-11 DIAGNOSIS — M549 Dorsalgia, unspecified: Secondary | ICD-10-CM

## 2011-12-11 MED ORDER — TRAMADOL HCL 50 MG PO TABS: 50.0000 mg | ORAL_TABLET | Freq: Four times a day (QID) | ORAL | Status: DC | PRN

## 2011-12-11 NOTE — Progress Notes (Signed)
  Subjective:    Patient ID: Marvin Clark, male    DOB: 03-05-1956, 56 y.o.   MRN: [id number]  HPI Weight gain due to craving sweets   Review of Systems  Constitutional: Negative for fever and fatigue.  HENT: Negative for hearing loss, congestion, neck pain and postnasal drip.   Eyes: Negative for discharge, redness and visual disturbance.  Respiratory: Negative for cough, shortness of breath and wheezing.   Cardiovascular: Negative for leg swelling.  Gastrointestinal: Negative for abdominal pain, constipation and abdominal distention.  Genitourinary: Negative for urgency and frequency.  Musculoskeletal: Negative for joint swelling and arthralgias.  Skin: Negative for color change and rash.  Neurological: Negative for weakness and light-headedness.  Hematological: Negative for adenopathy.  Psychiatric/Behavioral: Negative for behavioral problems.       Past Medical History  Diagnosis Date  . Depression   . Diverticulosis of colon   . Hyperlipidemia     History   Social History  . Marital Status: Divorced    Spouse Name: N/A    Number of Children: N/A  . Years of Education: N/A   Occupational History  . Not on file.   Social History Main Topics  . Smoking status: Never Smoker   . Smokeless tobacco: Not on file  . Alcohol Use: No  . Drug Use: No  . Sexually Active: Yes   Other Topics Concern  . Not on file   Social History Narrative  . No narrative on file    No past surgical history on file.  Family History  Problem Relation Age of Onset  . COPD Mother   . Cancer Father     No Known Allergies  Current Outpatient Prescriptions on File Prior to Visit  Medication Sig Dispense Refill  . cyclobenzaprine (FLEXERIL) 5 MG tablet TAKE ONE TABLET BY MOUTH TWICE DAILY AS NEEDED FOR MUSCLE SPASM  30 tablet  6  . DULoxetine (CYMBALTA) 60 MG capsule Take 1 capsule (60 mg total) by mouth daily.  30 capsule  3  . pravastatin (PRAVACHOL) 40 MG tablet Take 1  tablet (40 mg total) by mouth daily.  30 tablet  11  . traMADol (ULTRAM) 50 MG tablet Take 50 mg by mouth every 6 (six) hours as needed.        . traMADol (ULTRAM) 50 MG tablet TAKE ONE TABLET BY MOUTH EVERY 6 HOURS AS NEEDED FOR PAIN  60 tablet  3  . traZODone (DESYREL) 100 MG tablet TAKE ONE TABLET BY MOUTH AT BEDTIME  30 tablet  6    BP 136/82  Pulse 76  Temp 98.2 F (36.8 C)  Resp 16  Ht 5\' 9"  (1.753 m)  Wt 210 lb (95.255 kg)  BMI 31.01 kg/m2    Objective:   Physical Exam  Nursing note and vitals reviewed. Constitutional: He appears well-developed and well-nourished.  HENT:  Head: Normocephalic and atraumatic.  Eyes: Conjunctivae are normal. Pupils are equal, round, and reactive to light.  Neck: Normal range of motion. Neck supple.  Cardiovascular: Normal rate and regular rhythm.   Pulmonary/Chest: Effort normal and breath sounds normal.  Abdominal: Soft. Bowel sounds are normal.          Assessment & Plan:  Refill medications for depression Discussed DM risks with weight gain

## 2011-12-11 NOTE — Patient Instructions (Signed)
The patient is instructed to continue all medications as prescribed. Schedule followup with check out clerk upon leaving the clinic  

## 2011-12-13 ENCOUNTER — Other Ambulatory Visit: Payer: Self-pay | Admitting: Internal Medicine

## 2012-02-24 ENCOUNTER — Other Ambulatory Visit: Payer: Self-pay | Admitting: Internal Medicine

## 2012-02-28 ENCOUNTER — Other Ambulatory Visit: Payer: Self-pay | Admitting: Internal Medicine

## 2012-03-15 ENCOUNTER — Other Ambulatory Visit: Payer: Self-pay | Admitting: Internal Medicine

## 2012-03-19 ENCOUNTER — Other Ambulatory Visit: Payer: Self-pay | Admitting: Internal Medicine

## 2012-03-19 ENCOUNTER — Telehealth: Payer: Self-pay | Admitting: Internal Medicine

## 2012-03-19 NOTE — Telephone Encounter (Signed)
Pt informed - up front

## 2012-03-19 NOTE — Telephone Encounter (Signed)
Patient called stating that he would like to have samples of cymbalta. Please assist.

## 2012-03-19 NOTE — Telephone Encounter (Signed)
done

## 2012-04-15 ENCOUNTER — Ambulatory Visit: Payer: Self-pay | Admitting: Internal Medicine

## 2012-04-17 ENCOUNTER — Ambulatory Visit: Payer: Self-pay | Admitting: Internal Medicine

## 2012-04-22 ENCOUNTER — Other Ambulatory Visit: Payer: Self-pay | Admitting: Internal Medicine

## 2012-04-22 ENCOUNTER — Encounter: Payer: Self-pay | Admitting: Internal Medicine

## 2012-04-22 ENCOUNTER — Ambulatory Visit (INDEPENDENT_AMBULATORY_CARE_PROVIDER_SITE_OTHER): Payer: Self-pay | Admitting: Internal Medicine

## 2012-04-22 VITALS — BP 136/80 | HR 76 | Temp 98.6°F | Resp 16 | Ht 68.0 in | Wt 212.0 lb

## 2012-04-22 DIAGNOSIS — E785 Hyperlipidemia, unspecified: Secondary | ICD-10-CM

## 2012-04-22 DIAGNOSIS — M5137 Other intervertebral disc degeneration, lumbosacral region: Secondary | ICD-10-CM

## 2012-04-22 DIAGNOSIS — M51379 Other intervertebral disc degeneration, lumbosacral region without mention of lumbar back pain or lower extremity pain: Secondary | ICD-10-CM

## 2012-04-22 NOTE — Progress Notes (Signed)
  Subjective:    Patient ID: Marvin Clark, male    DOB: 1955/10/31, 56 y.o.   MRN: [id number]  HPI Follow up lipids and depression Still does not have health insurance The ultram 50mg  has been working     Review of Systems  Constitutional: Negative for fever and fatigue.  HENT: Negative for hearing loss, congestion, neck pain and postnasal drip.   Eyes: Negative for discharge, redness and visual disturbance.  Respiratory: Negative for cough, shortness of breath and wheezing.   Cardiovascular: Negative for leg swelling.  Gastrointestinal: Negative for abdominal pain, constipation and abdominal distention.  Genitourinary: Negative for urgency and frequency.  Musculoskeletal: Negative for joint swelling and arthralgias.  Skin: Negative for color change and rash.  Neurological: Negative for weakness and light-headedness.  Hematological: Negative for adenopathy.  Psychiatric/Behavioral: Negative for behavioral problems.   Past Medical History  Diagnosis Date  . Depression   . Diverticulosis of colon   . Hyperlipidemia     History   Social History  . Marital Status: Divorced    Spouse Name: N/A    Number of Children: N/A  . Years of Education: N/A   Occupational History  . Not on file.   Social History Main Topics  . Smoking status: Never Smoker   . Smokeless tobacco: Not on file  . Alcohol Use: No  . Drug Use: No  . Sexually Active: Yes   Other Topics Concern  . Not on file   Social History Narrative  . No narrative on file    No past surgical history on file.  Family History  Problem Relation Age of Onset  . COPD Mother   . Cancer Father     No Known Allergies  Current Outpatient Prescriptions on File Prior to Visit  Medication Sig Dispense Refill  . cyclobenzaprine (FLEXERIL) 5 MG tablet TAKE ONE TABLET BY MOUTH TWICE DAILY AS NEEDED FOR MUSCLE SPASM  30 tablet  1  . DULoxetine (CYMBALTA) 60 MG capsule Take 1 capsule (60 mg total) by mouth  daily.  30 capsule  3  . pravastatin (PRAVACHOL) 40 MG tablet TAKE ONE TABLET BY MOUTH EVERY DAY  30 tablet  5  . traMADol (ULTRAM) 50 MG tablet TAKE ONE TABLET BY MOUTH EVERY 6 HOURS AS NEEDED FOR PAIN  60 tablet  3  . traMADol (ULTRAM) 50 MG tablet TAKE ONE TABLET BY MOUTH EVERY 6 HOURS AS NEEDED  50 tablet  3  . traZODone (DESYREL) 100 MG tablet TAKE ONE TABLET BY MOUTH AT BEDTIME  30 tablet  6    BP 136/80  Pulse 76  Temp 98.6 F (37 C)  Resp 16  Ht 5\' 8"  (1.727 m)  Wt 212 lb (96.163 kg)  BMI 32.23 kg/m2        Objective:   Physical Exam  Nursing note and vitals reviewed. Constitutional: He appears well-developed and well-nourished.  HENT:  Head: Normocephalic and atraumatic.  Eyes: Conjunctivae are normal. Pupils are equal, round, and reactive to light.  Neck: Normal range of motion. Neck supple.  Cardiovascular: Normal rate and regular rhythm.   Pulmonary/Chest: Effort normal and breath sounds normal.  Abdominal: Soft. Bowel sounds are normal.          Assessment & Plan:  Mood stable cymbalta still working stable Back pain stable

## 2012-04-22 NOTE — Patient Instructions (Addendum)
The patient is instructed to continue all medications as prescribed. Schedule followup with check out clerk upon leaving the clinic  

## 2012-05-29 ENCOUNTER — Other Ambulatory Visit: Payer: Self-pay | Admitting: *Deleted

## 2012-05-29 ENCOUNTER — Other Ambulatory Visit: Payer: Self-pay | Admitting: Internal Medicine

## 2012-05-29 MED ORDER — TRAZODONE HCL 100 MG PO TABS: 100.0000 mg | ORAL_TABLET | Freq: Every day | ORAL | Status: DC

## 2012-06-24 ENCOUNTER — Other Ambulatory Visit: Payer: Self-pay | Admitting: Internal Medicine

## 2012-07-12 ENCOUNTER — Other Ambulatory Visit: Payer: Self-pay | Admitting: Internal Medicine

## 2012-07-19 ENCOUNTER — Other Ambulatory Visit: Payer: Self-pay | Admitting: Internal Medicine

## 2012-07-22 ENCOUNTER — Other Ambulatory Visit: Payer: Self-pay | Admitting: *Deleted

## 2012-07-22 MED ORDER — TRAMADOL HCL 50 MG PO TABS: 50.0000 mg | ORAL_TABLET | Freq: Four times a day (QID) | ORAL | Status: DC | PRN

## 2012-07-26 ENCOUNTER — Other Ambulatory Visit: Payer: Self-pay | Admitting: Internal Medicine

## 2012-08-09 ENCOUNTER — Encounter: Payer: Self-pay | Admitting: Internal Medicine

## 2012-08-09 ENCOUNTER — Ambulatory Visit (INDEPENDENT_AMBULATORY_CARE_PROVIDER_SITE_OTHER): Payer: Self-pay | Admitting: Internal Medicine

## 2012-08-09 VITALS — BP 140/90 | HR 76 | Temp 98.2°F | Resp 16 | Ht 68.0 in | Wt 212.0 lb

## 2012-08-09 DIAGNOSIS — E785 Hyperlipidemia, unspecified: Secondary | ICD-10-CM

## 2012-08-09 NOTE — Progress Notes (Signed)
  Subjective:    Patient ID: Marvin Clark, male    DOB: 1956-03-26, 56 y.o.   MRN: [id number]  HPI  monitoring depression  Review of Systems  Constitutional: Negative for fever and fatigue.  HENT: Negative for hearing loss, congestion, neck pain and postnasal drip.   Eyes: Negative for discharge, redness and visual disturbance.  Respiratory: Negative for cough, shortness of breath and wheezing.   Cardiovascular: Negative for leg swelling.  Gastrointestinal: Negative for abdominal pain, constipation and abdominal distention.  Genitourinary: Negative for urgency and frequency.  Musculoskeletal: Negative for joint swelling and arthralgias.  Skin: Negative for color change and rash.  Neurological: Negative for weakness and light-headedness.  Hematological: Negative for adenopathy.  Psychiatric/Behavioral: Negative for behavioral problems.   Past Medical History  Diagnosis Date  . Depression   . Diverticulosis of colon   . Hyperlipidemia     History   Social History  . Marital Status: Divorced    Spouse Name: N/A    Number of Children: N/A  . Years of Education: N/A   Occupational History  . Not on file.   Social History Main Topics  . Smoking status: Never Smoker   . Smokeless tobacco: Not on file  . Alcohol Use: No  . Drug Use: No  . Sexually Active: Yes   Other Topics Concern  . Not on file   Social History Narrative  . No narrative on file    No past surgical history on file.  Family History  Problem Relation Age of Onset  . COPD Mother   . Cancer Father     No Known Allergies  Current Outpatient Prescriptions on File Prior to Visit  Medication Sig Dispense Refill  . cyclobenzaprine (FLEXERIL) 5 MG tablet TAKE ONE TABLET BY MOUTH TWICE DAILY AS NEEDED FOR MUSCLE SPASM  30 tablet  0  . DULoxetine (CYMBALTA) 60 MG capsule Take 1 capsule (60 mg total) by mouth daily.  30 capsule  3  . pravastatin (PRAVACHOL) 40 MG tablet TAKE ONE TABLET BY MOUTH  EVERY DAY  30 tablet  5  . traMADol (ULTRAM) 50 MG tablet Take 1 tablet (50 mg total) by mouth every 6 (six) hours as needed for pain.  60 tablet  3  . traZODone (DESYREL) 100 MG tablet Take 1 tablet (100 mg total) by mouth at bedtime.  30 tablet  6    BP 140/90  Pulse 76  Temp 98.2 F (36.8 C)  Resp 16  Ht 5\' 8"  (1.727 m)  Wt 212 lb (96.163 kg)  BMI 32.23 kg/m2       Objective:   Physical Exam  Nursing note and vitals reviewed. Constitutional: He appears well-developed and well-nourished.  HENT:  Head: Normocephalic and atraumatic.  Eyes: Conjunctivae normal are normal. Pupils are equal, round, and reactive to light.  Neck: Normal range of motion. Neck supple.  Cardiovascular: Normal rate and regular rhythm.   Pulmonary/Chest: Effort normal and breath sounds normal.  Abdominal: Soft. Bowel sounds are normal.          Assessment & Plan:  Stable on cymbalta

## 2012-08-09 NOTE — Patient Instructions (Signed)
[email or web address]

## 2012-08-10 ENCOUNTER — Other Ambulatory Visit: Payer: Self-pay | Admitting: Internal Medicine

## 2012-08-21 ENCOUNTER — Other Ambulatory Visit: Payer: Self-pay | Admitting: Internal Medicine

## 2012-08-26 ENCOUNTER — Ambulatory Visit: Payer: Self-pay | Admitting: Internal Medicine

## 2012-09-09 ENCOUNTER — Other Ambulatory Visit: Payer: Self-pay | Admitting: Internal Medicine

## 2012-09-28 ENCOUNTER — Emergency Department (HOSPITAL_COMMUNITY): Payer: Self-pay

## 2012-09-28 ENCOUNTER — Encounter (HOSPITAL_COMMUNITY): Payer: Self-pay | Admitting: *Deleted

## 2012-09-28 ENCOUNTER — Emergency Department (HOSPITAL_COMMUNITY)
Admission: EM | Admit: 2012-09-28 | Discharge: 2012-09-28 | Disposition: A | Discharge status: Home / Self Care | Payer: Self-pay | Attending: Emergency Medicine | Admitting: Emergency Medicine

## 2012-09-28 DIAGNOSIS — F329 Major depressive disorder, single episode, unspecified: Secondary | ICD-10-CM | POA: Insufficient documentation

## 2012-09-28 DIAGNOSIS — Z8719 Personal history of other diseases of the digestive system: Secondary | ICD-10-CM | POA: Insufficient documentation

## 2012-09-28 DIAGNOSIS — E785 Hyperlipidemia, unspecified: Secondary | ICD-10-CM | POA: Insufficient documentation

## 2012-09-28 DIAGNOSIS — F3289 Other specified depressive episodes: Secondary | ICD-10-CM | POA: Insufficient documentation

## 2012-09-28 DIAGNOSIS — K209 Esophagitis, unspecified without bleeding: Secondary | ICD-10-CM | POA: Insufficient documentation

## 2012-09-28 DIAGNOSIS — Z79899 Other long term (current) drug therapy: Secondary | ICD-10-CM | POA: Insufficient documentation

## 2012-09-28 LAB — CBC WITH DIFFERENTIAL/PLATELET
Basophils Absolute: 0 10*3/uL (ref 0.0–0.1)
Basophils Relative: 0 % (ref 0–1)
Eosinophils Absolute: 0.1 10*3/uL (ref 0.0–0.7)
Eosinophils Relative: 1 % (ref 0–5)
HCT: 41.7 % (ref 39.0–52.0)
Hemoglobin: 14.3 g/dL (ref 13.0–17.0)
Lymphocytes Relative: 29 % (ref 12–46)
Lymphs Abs: 2.3 10*3/uL (ref 0.7–4.0)
MCH: 29.7 pg (ref 26.0–34.0)
MCHC: 34.3 g/dL (ref 30.0–36.0)
MCV: 86.5 fL (ref 78.0–100.0)
Monocytes Absolute: 0.4 10*3/uL (ref 0.1–1.0)
Monocytes Relative: 5 % (ref 3–12)
Neutro Abs: 5.1 10*3/uL (ref 1.7–7.7)
Neutrophils Relative %: 64 % (ref 43–77)
Platelets: 256 10*3/uL (ref 150–400)
RBC: 4.82 MIL/uL (ref 4.22–5.81)
RDW: 12.4 % (ref 11.5–15.5)
WBC: 7.9 10*3/uL (ref 4.0–10.5)

## 2012-09-28 LAB — BASIC METABOLIC PANEL
BUN: 6 mg/dL (ref 6–23)
CO2: 28 mEq/L (ref 19–32)
Calcium: 9.3 mg/dL (ref 8.4–10.5)
Chloride: 97 mEq/L (ref 96–112)
Creatinine, Ser: 0.65 mg/dL (ref 0.50–1.35)
GFR calc Af Amer: >90 mL/min (ref >90)
GFR calc non Af Amer: >90 mL/min (ref >90)
Glucose, Bld: 107 mg/dL — ABNORMAL HIGH (ref 70–99)
Potassium: 3.8 mEq/L (ref 3.5–5.1)
Sodium: 134 mEq/L — ABNORMAL LOW (ref 135–145)

## 2012-09-28 MED ORDER — HYDROCODONE-ACETAMINOPHEN 5-325 MG PO TABS
1.0000 | ORAL_TABLET | Freq: Once | ORAL | Status: AC
  Administered 2012-09-28: 1 via ORAL
  Filled 2012-09-28: qty 1

## 2012-09-28 MED ORDER — OMEPRAZOLE 20 MG PO CPDR: 20.0000 mg | DELAYED_RELEASE_CAPSULE | Freq: Every day | ORAL | Status: DC

## 2012-09-28 MED ORDER — HYDROCODONE-ACETAMINOPHEN 5-325 MG PO TABS: 1.0000 | ORAL_TABLET | ORAL | Status: DC | PRN

## 2012-09-28 MED ORDER — IOHEXOL 300 MG/ML  SOLN
100.0000 mL | Freq: Once | INTRAMUSCULAR | Status: AC | PRN
  Administered 2012-09-28: 100 mL via INTRAVENOUS

## 2012-09-28 NOTE — ED Notes (Signed)
?   Bite with ? Spider. Then Tuesday woke up with 1/2 size  of a baseball. Swallowing started to make the right side of neck hurt  on Wednesday & Thursday. Now noted Thursday night left side of neck started hurting & swallowing now makes left side of neck hurt. Pain scale for left side of neck is 8/10. Denies fever.

## 2012-09-28 NOTE — ED Notes (Signed)
Pt in c/o pain and swelling to his neck, symptoms first started Monday night when he thought something bit him to the right side of his neck, next morning noted swelling, improved then swelling returned, then swelling started to left side of neck, increased pain with swallowing.

## 2012-09-28 NOTE — ED Provider Notes (Addendum)
Pt to the ER with concerns about neck swelling and mild pain. Originally seen by Charlestine Night, PA-C and he is awaiting CT of the soft tissue of the neck to r/o any deep infection or masses. The CT is negative for any acute abnormalities but it is noted that he has possible mild esophagitis. Discussed results with patient. Will start him on Prilosec for esophagitis and give him GI referral if he continues to be symptomatic.  Pt has been advised of the symptoms that warrant their return to the ED. Patient has voiced understanding and has agreed to follow-up with the PCP or specialist.     Results for orders placed during the hospital encounter of 09/28/12  CBC WITH DIFFERENTIAL      Component Value Range   WBC 7.9  4.0 - 10.5 K/uL   RBC 4.82  4.22 - 5.81 MIL/uL   Hemoglobin 14.3  13.0 - 17.0 g/dL   HCT 16.1  09.6 - 04.5 %   MCV 86.5  78.0 - 100.0 fL   MCH 29.7  26.0 - 34.0 pg   MCHC 34.3  30.0 - 36.0 g/dL   RDW 40.9  81.1 - 91.4 %   Platelets 256  150 - 400 K/uL   Neutrophils Relative 64  43 - 77 %   Neutro Abs 5.1  1.7 - 7.7 K/uL   Lymphocytes Relative 29  12 - 46 %   Lymphs Abs 2.3  0.7 - 4.0 K/uL   Monocytes Relative 5  3 - 12 %   Monocytes Absolute 0.4  0.1 - 1.0 K/uL   Eosinophils Relative 1  0 - 5 %   Eosinophils Absolute 0.1  0.0 - 0.7 K/uL   Basophils Relative 0  0 - 1 %   Basophils Absolute 0.0  0.0 - 0.1 K/uL  BASIC METABOLIC PANEL      Component Value Range   Sodium 134 (*) 135 - 145 mEq/L   Potassium 3.8  3.5 - 5.1 mEq/L   Chloride 97  96 - 112 mEq/L   CO2 28  19 - 32 mEq/L   Glucose, Bld 107 (*) 70 - 99 mg/dL   BUN 6  6 - 23 mg/dL   Creatinine, Ser 7.82  0.50 - 1.35 mg/dL   Calcium 9.3  8.4 - 95.6 mg/dL   GFR calc non Af Amer >90  >90 mL/min   GFR calc Af Amer >90  >90 mL/min   Ct Soft Tissue Neck W Contrast  09/28/2012  *RADIOLOGY REPORT*  Clinical Data: 57 year old male with neck pain and swelling. Possible spider bite.  CT NECK WITH CONTRAST   Technique:  Multidetector CT imaging of the neck was performed with intravenous contrast.  Contrast: OMNIPAQUE IOHEXOL 300 MG/ML  SOLN  Comparison: None.  Findings: Large body habitus.  Lung apices are clear.  Superior mediastinal lipomatosis.  Thickening of the upper thoracic esophagus.  No surrounding stranding.  No superior mediastinal lymphadenopathy.  Motion artifact at the level of the oropharynx and hypopharynx. Nasopharynx within normal limits.  Parapharyngeal spaces are normal.  Retropharyngeal space appears normal.  Lower hypopharynx and larynx are within normal limits.  Thyroid within normal limits. Normal epiglottis.  Sublingual space, submandibular glands and parotid glands are within normal limits.  Visualized orbit soft tissues are within normal limits.  Negative visualized brain parenchyma.  Visualized major vascular structures are patent.  Mild atherosclerosis.  No cervical lymphadenopathy.  No focal fat stranding or inflammatory changes identified in the  neck.  Visualized paranasal sinuses and mastoids are clear.  Degenerative changes in the cervical spine. No acute osseous abnormality identified.  Incidental torus mandibularis.  IMPRESSION: 1.  Motion artifact at the level of the oropharynx, but no inflammatory changes, mass or lymphadenopathy identified in the neck. 2.  Mild circumferential thickening of the upper thoracic esophagus, query mild esophagitis, or this could be artifactual.   Original Report Authenticated By: Erskine Speed, M.D.         Dorthula Matas, PA 09/28/12 2057  Dorthula Matas, PA 09/28/12 2059  Dorthula Matas, PA 09/29/12 [phone]

## 2012-09-28 NOTE — ED Notes (Signed)
Bedside report received from previous RN 

## 2012-09-28 NOTE — ED Provider Notes (Signed)
History     CSN: [id number]  Arrival date & time 09/28/12  1739   First MD Initiated Contact with Patient 09/28/12 1823      Chief Complaint  Patient presents with  . Neck Pain    (Consider location/radiation/quality/duration/timing/severity/associated sxs/prior treatment) HPI Patient presents to the emergency department with neck pain and swelling.  Patient states that he noticed right-sided posterior lateral neck discomfort with some swelling on Monday and started feeling some better.  By Wednesday, but then noticed he had discomfort on the left with swelling in the same area.  Patient denies fever, chest pain, shortness of breath, headache, back pain, visual changes, sore throat, difficulty swallowing, difficulty breathing, dizziness, or syncope.  Patient states he did not take anything prior to arrival for his symptoms.  Past Medical History  Diagnosis Date  . Depression   . Diverticulosis of colon   . Hyperlipidemia     History reviewed. No pertinent past surgical history.  Family History  Problem Relation Age of Onset  . COPD Mother   . Cancer Father     History  Substance Use Topics  . Smoking status: Never Smoker   . Smokeless tobacco: Not on file  . Alcohol Use: No      Review of Systems All other systems negative except as documented in the HPI. All pertinent positives and negatives as reviewed in the HPI. Allergies  Review of patient's allergies indicates no known allergies.  Home Medications   Current Outpatient Rx  Name  Route  Sig  Dispense  Refill  . CYCLOBENZAPRINE HCL 5 MG PO TABS   Oral   Take 10 mg by mouth 3 (three) times daily as needed. Muscle spasms         . DULOXETINE HCL 60 MG PO CPEP   Oral   Take 60 mg by mouth every morning.          Marland Kitchen PRAVASTATIN SODIUM 40 MG PO TABS   Oral   Take 40 mg by mouth every morning.         Marland Kitchen TRAMADOL HCL 50 MG PO TABS   Oral   Take 50 mg by mouth every 6 (six) hours as needed. Back pain        . TRAZODONE HCL 100 MG PO TABS   Oral   Take 100 mg by mouth at bedtime.           BP 138/70  Pulse 82  Temp 98.1 F (36.7 C) (Oral)  Resp 20  SpO2 100%  Physical Exam  Nursing note and vitals reviewed. Constitutional: He appears well-developed and well-nourished.  HENT:  Head: Normocephalic and atraumatic.  Neck: Trachea normal and normal range of motion. Muscular tenderness present. No spinous process tenderness present. No rigidity. No erythema and normal range of motion present. No mass present.    Cardiovascular: Normal rate, regular rhythm and normal heart sounds.  Exam reveals no gallop and no friction rub.   No murmur heard. Pulmonary/Chest: Effort normal and breath sounds normal.  Abdominal: Soft. Bowel sounds are normal.  Musculoskeletal:       Back:    ED Course  Procedures (including critical care time)  Labs Reviewed  BASIC METABOLIC PANEL - Abnormal; Notable for the following:    Sodium 134 (*)     Glucose, Bld 107 (*)     All other components within normal limits  CBC WITH DIFFERENTIAL   Patient is awaiting results of CT scan. Marlon Pel  will take control of patient until all results are back. The patient does not have any Rigidity. Normal swallowing. Handling secreations  MDM         Carlyle Dolly, PA-C 10/03/12 1220

## 2012-09-29 NOTE — ED Provider Notes (Signed)
Medical screening examination/treatment/procedure(s) were performed by non-physician practitioner and as supervising physician I was immediately available for consultation/collaboration.   Flint Melter, MD 09/29/12 [phone]

## 2012-09-30 NOTE — ED Provider Notes (Signed)
Medical screening examination/treatment/procedure(s) were performed by non-physician practitioner and as supervising physician I was immediately available for consultation/collaboration.   Flint Melter, MD 09/30/12 [phone]

## 2012-10-01 ENCOUNTER — Telehealth: Payer: Self-pay | Admitting: *Deleted

## 2012-10-01 MED ORDER — CYCLOBENZAPRINE HCL 5 MG PO TABS: 10.0000 mg | ORAL_TABLET | Freq: Three times a day (TID) | ORAL | Status: DC | PRN

## 2012-10-01 NOTE — Telephone Encounter (Signed)
completed

## 2012-10-03 NOTE — ED Provider Notes (Signed)
Medical screening examination/treatment/procedure(s) were performed by non-physician practitioner and as supervising physician I was immediately available for consultation/collaboration.   Flint Melter, MD 10/03/12 [phone]

## 2012-10-04 ENCOUNTER — Telehealth: Payer: Self-pay | Admitting: Internal Medicine

## 2012-10-04 NOTE — Telephone Encounter (Signed)
Explained to patient that pravastatin is the cheapest medication.

## 2012-10-04 NOTE — Telephone Encounter (Signed)
Pt want to know if there is a generic drug he can take to substitute his pravastatin (PRAVACHOL) 40 MG tablet for?   Pt is out of med and needs to know asap! Pharm: [facility]/ [address]

## 2012-10-04 NOTE — Telephone Encounter (Signed)
Patient states that the cost has gone from $4 - $16 .  Suggestions?

## 2012-10-10 ENCOUNTER — Other Ambulatory Visit: Payer: Self-pay | Admitting: Internal Medicine

## 2012-11-13 ENCOUNTER — Ambulatory Visit: Payer: Self-pay | Admitting: Internal Medicine

## 2012-11-14 ENCOUNTER — Other Ambulatory Visit: Payer: Self-pay | Admitting: Internal Medicine

## 2012-11-19 ENCOUNTER — Telehealth: Payer: Self-pay | Admitting: Internal Medicine

## 2012-11-19 MED ORDER — DULOXETINE HCL 60 MG PO CPEP: 60.0000 mg | ORAL_CAPSULE | Freq: Every morning | ORAL | Status: DC

## 2012-11-19 NOTE — Telephone Encounter (Signed)
Cymbalta is now generic and we no longer have samples.  Patient is aware and a refill sent.

## 2012-11-19 NOTE — Telephone Encounter (Signed)
Patient called stating that he would like to have samples of cymbalta 60 mg 1poqd. Please assist.

## 2012-11-26 ENCOUNTER — Other Ambulatory Visit: Payer: Self-pay | Admitting: Internal Medicine

## 2012-12-07 ENCOUNTER — Other Ambulatory Visit: Payer: Self-pay | Admitting: Internal Medicine

## 2012-12-09 ENCOUNTER — Encounter: Payer: Self-pay | Admitting: Gastroenterology

## 2012-12-29 ENCOUNTER — Other Ambulatory Visit: Payer: Self-pay | Admitting: Internal Medicine

## 2013-01-06 ENCOUNTER — Ambulatory Visit (INDEPENDENT_AMBULATORY_CARE_PROVIDER_SITE_OTHER): Payer: Self-pay | Admitting: Internal Medicine

## 2013-01-06 ENCOUNTER — Encounter: Payer: Self-pay | Admitting: Internal Medicine

## 2013-01-06 VITALS — BP 116/78 | HR 72 | Temp 98.2°F | Resp 16 | Ht 68.0 in | Wt 208.0 lb

## 2013-01-06 DIAGNOSIS — Z23 Encounter for immunization: Secondary | ICD-10-CM

## 2013-01-06 DIAGNOSIS — M545 Low back pain: Secondary | ICD-10-CM

## 2013-01-06 DIAGNOSIS — M533 Sacrococcygeal disorders, not elsewhere classified: Secondary | ICD-10-CM

## 2013-01-06 MED ORDER — TRAMADOL HCL 50 MG PO TABS: ORAL_TABLET | ORAL | Status: DC

## 2013-01-06 MED ORDER — CYCLOBENZAPRINE HCL 5 MG PO TABS: 10.0000 mg | ORAL_TABLET | Freq: Three times a day (TID) | ORAL | Status: DC | PRN

## 2013-01-06 MED ORDER — TRAZODONE HCL 100 MG PO TABS: ORAL_TABLET | ORAL | Status: DC

## 2013-01-06 NOTE — Patient Instructions (Addendum)
In late may early June [facility] [address] my office for a referral next month  For now look on the affordable health care site and look at a silver or gold coventry plan? Also BCBS?

## 2013-01-06 NOTE — Progress Notes (Signed)
Subjective:    Patient ID: Marvin Clark, male    DOB: 05-11-1956, 57 y.o.   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  HENT: Negative for hearing loss, congestion, neck pain and postnasal drip.   Eyes: Negative for discharge, redness and visual disturbance.  Respiratory: Negative for cough, shortness of breath and wheezing.   Cardiovascular: Positive for chest pain.  Gastrointestinal: Negative for abdominal pain, constipation and abdominal distention.  Genitourinary: Negative for urgency and frequency.  Musculoskeletal: Positive for back pain, joint swelling, arthralgias and gait problem.  Skin: Negative for color change and rash.  Neurological: Positive for weakness. Negative for light-headedness.  Hematological: Negative for adenopathy.  Psychiatric/Behavioral: Negative for behavioral problems.   Past Medical History  Diagnosis Date  . Depression   . Diverticulosis of colon   . Hyperlipidemia     History   Social History  . Marital Status: Divorced    Spouse Name: N/A    Number of Children: N/A  . Years of Education: N/A   Occupational History  . Not on file.   Social History Main Topics  . Smoking status: Never Smoker   . Smokeless tobacco: Not on file  . Alcohol Use: No  . Drug Use: No  . Sexually Active: Yes   Other Topics Concern  . Not on file   Social History Narrative  . No narrative on file    No past surgical history on file.  Family History  Problem Relation Age of Onset  . COPD Mother   . Cancer Father     No Known Allergies  Current  Outpatient Prescriptions on File Prior to Visit  Medication Sig Dispense Refill  . cyclobenzaprine (FLEXERIL) 5 MG tablet Take 2 tablets (10 mg total) by mouth 3 (three) times daily as needed. Muscle spasms  30 tablet  2  . DULoxetine (CYMBALTA) 60 MG capsule Take 1 capsule (60 mg total) by mouth every morning.  90 capsule  0  . HYDROcodone-acetaminophen (NORCO/VICODIN) 5-325 MG per tablet Take 1-2 tablets by mouth every 4 (four) hours as needed for pain.  10 tablet  0  . omeprazole (PRILOSEC) 20 MG capsule Take 1 capsule (20 mg total) by mouth daily.  30 capsule  0  . pravastatin (PRAVACHOL) 40 MG tablet Take 40 mg by mouth every morning.      . traMADol (ULTRAM) 50 MG tablet TAKE ONE TABLET BY MOUTH EVERY 6 HOURS AS NEEDED FOR PAIN  60 tablet  1  . traZODone (DESYREL) 100 MG tablet TAKE ONE TABLET BY MOUTH DAILY AT BEDTIME  30 tablet  3   No current facility-administered medications on file prior to visit.    BP 116/78  Pulse 72  Temp(Src) 98.2 F (36.8 C)  Resp 16  Ht 5\' 8"  (1.727 m)  Wt 208 lb (94.348 kg)  BMI 31.63 kg/m2       Objective:   Physical Exam  Nursing note and vitals reviewed. Constitutional: He appears well-developed and well-nourished.  HENT:  Head: Normocephalic and atraumatic.  Eyes: Conjunctivae are normal. Pupils are equal, round, and reactive to  light.  Neck: Normal range of motion. Neck supple.  Cardiovascular: Normal rate and regular rhythm.   Pulmonary/Chest: Effort normal and breath sounds normal.  Abdominal: Soft. Bowel sounds are normal.  Musculoskeletal: He exhibits edema and tenderness.  Neurological: Coordination abnormal.  Skin: Skin is warm and dry.          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

## 2013-01-06 NOTE — Addendum Note (Signed)
Addended by: Willy Eddy on: 01/06/2013 09:19 AM   Modules accepted: Orders

## 2013-01-29 ENCOUNTER — Telehealth: Payer: Self-pay | Admitting: Internal Medicine

## 2013-01-29 NOTE — Telephone Encounter (Signed)
Do you know what he might be talking about?

## 2013-01-29 NOTE — Telephone Encounter (Signed)
PT stated that Dr. Lovell Sheehan told him to call in at the end of this month or the beginning of next month to obtain a referral regarding his lack of insurance and a new clinic. Please assist.

## 2013-01-30 NOTE — Telephone Encounter (Signed)
[facility], located at [address] serves uninsured/underinsured patients.  Appointments can be made by calling [phone].

## 2013-01-31 NOTE — Telephone Encounter (Signed)
Left message on machine For pt 

## 2013-01-31 NOTE — Telephone Encounter (Signed)
Thanks for in the info-I didn't know this existed

## 2013-02-12 ENCOUNTER — Other Ambulatory Visit: Payer: Self-pay | Admitting: Internal Medicine

## 2013-02-14 ENCOUNTER — Other Ambulatory Visit: Payer: Self-pay | Admitting: *Deleted

## 2013-02-14 MED ORDER — TRAMADOL HCL 50 MG PO TABS: ORAL_TABLET | ORAL | Status: DC

## 2013-02-14 NOTE — Telephone Encounter (Signed)
Pt called back regarding this issue, wondering where his referral was. I explained that he did not need a referral. Per pt's request, I called his cell phone and left the clinic's name, address, and telephone on his voice mail.

## 2013-03-03 ENCOUNTER — Ambulatory Visit: Payer: Self-pay | Admitting: Internal Medicine

## 2013-03-10 ENCOUNTER — Telehealth: Payer: Self-pay | Admitting: Internal Medicine

## 2013-03-10 NOTE — Telephone Encounter (Signed)
PT stated that it is [facility], and has the new [facility] and health facility listed on it. He stated that Dr. Lovell Sheehan told him previously that he would continue to work with him, if this stands to be true, he would like to schedule an appt. Thank you!

## 2013-03-10 NOTE — Telephone Encounter (Signed)
Is this [employer] [employer] or regular medicaid- does the banner for insurance need to be changed

## 2013-03-10 NOTE — Telephone Encounter (Signed)
Per dr Paula Libra- we can refill meds, but until we get the contract with them , we can not see him unless he pays cash.. When we do get the contract, dr Lovell Sheehan will take him back--please let me know if this is correct

## 2013-03-10 NOTE — Telephone Encounter (Signed)
PT called and requested a new RX of cymbalta, instead of samples. He stated that he is now on medicaid, and you had told him that he would get an RX of a generic cymbalta of 60mg , once he was on medication. Please assist.

## 2013-03-11 ENCOUNTER — Other Ambulatory Visit: Payer: Self-pay | Admitting: Internal Medicine

## 2013-03-12 NOTE — Telephone Encounter (Signed)
LMOVM for pt to call office 

## 2013-04-10 ENCOUNTER — Ambulatory Visit: Payer: Self-pay

## 2013-04-18 ENCOUNTER — Ambulatory Visit: Payer: Self-pay

## 2013-04-24 ENCOUNTER — Other Ambulatory Visit: Payer: Self-pay | Admitting: Internal Medicine

## 2013-05-08 ENCOUNTER — Other Ambulatory Visit: Payer: Self-pay | Admitting: Internal Medicine

## 2013-05-13 ENCOUNTER — Other Ambulatory Visit: Payer: Self-pay | Admitting: Internal Medicine

## 2013-05-23 ENCOUNTER — Other Ambulatory Visit: Payer: Self-pay | Admitting: Internal Medicine

## 2013-06-10 ENCOUNTER — Telehealth: Payer: Self-pay | Admitting: Internal Medicine

## 2013-06-10 NOTE — Telephone Encounter (Signed)
Too early- received #110m 12days ago

## 2013-06-10 NOTE — Telephone Encounter (Signed)
Pt needs new rx tramadol 50mg  sent to [facility]

## 2013-06-11 ENCOUNTER — Telehealth: Payer: Self-pay | Admitting: *Deleted

## 2013-06-11 NOTE — Telephone Encounter (Signed)
Left message on machine Non narcotic pain med, but dont take so much

## 2013-06-11 NOTE — Telephone Encounter (Signed)
Left message on machine for pt 

## 2013-06-11 NOTE — Telephone Encounter (Signed)
Pt is requesting to speak with Dr. Lovell Sheehan in regards to his pain medication. He states that he would like to speak with him in regards to his pain medication. He is questioning the active ingredients and if it is in fact a narcotic or not. Please advise.

## 2013-06-12 ENCOUNTER — Other Ambulatory Visit: Payer: Self-pay | Admitting: *Deleted

## 2013-06-12 MED ORDER — TRAMADOL HCL 50 MG PO TABS: ORAL_TABLET | ORAL | Status: DC

## 2013-07-01 ENCOUNTER — Other Ambulatory Visit: Payer: Self-pay | Admitting: Internal Medicine

## 2013-07-08 ENCOUNTER — Ambulatory Visit: Payer: Self-pay

## 2013-07-09 ENCOUNTER — Other Ambulatory Visit: Payer: Self-pay | Admitting: Internal Medicine

## 2013-07-14 ENCOUNTER — Other Ambulatory Visit: Payer: Self-pay | Admitting: Internal Medicine

## 2013-07-26 ENCOUNTER — Other Ambulatory Visit: Payer: Self-pay | Admitting: Internal Medicine

## 2013-07-28 ENCOUNTER — Other Ambulatory Visit: Payer: Self-pay | Admitting: *Deleted

## 2013-08-11 ENCOUNTER — Other Ambulatory Visit: Payer: Self-pay | Admitting: Internal Medicine

## 2013-08-15 ENCOUNTER — Ambulatory Visit: Payer: Medicaid Other | Attending: Internal Medicine | Admitting: Internal Medicine

## 2013-08-15 ENCOUNTER — Encounter: Payer: Self-pay | Admitting: Internal Medicine

## 2013-08-15 VITALS — BP 118/76 | HR 67 | Temp 98.5°F | Resp 16 | Ht 68.5 in | Wt 192.0 lb

## 2013-08-15 DIAGNOSIS — M533 Sacrococcygeal disorders, not elsewhere classified: Secondary | ICD-10-CM

## 2013-08-15 DIAGNOSIS — M545 Low back pain, unspecified: Secondary | ICD-10-CM | POA: Insufficient documentation

## 2013-08-15 DIAGNOSIS — E785 Hyperlipidemia, unspecified: Secondary | ICD-10-CM

## 2013-08-15 DIAGNOSIS — G8929 Other chronic pain: Secondary | ICD-10-CM | POA: Insufficient documentation

## 2013-08-15 MED ORDER — OMEPRAZOLE 20 MG PO CPDR: 20.0000 mg | DELAYED_RELEASE_CAPSULE | Freq: Every day | ORAL | Status: DC

## 2013-08-15 MED ORDER — PRAVASTATIN SODIUM 40 MG PO TABS: 40.0000 mg | ORAL_TABLET | Freq: Every morning | ORAL | Status: DC

## 2013-08-15 MED ORDER — CYCLOBENZAPRINE HCL 5 MG PO TABS: 10.0000 mg | ORAL_TABLET | Freq: Three times a day (TID) | ORAL | Status: DC | PRN

## 2013-08-15 NOTE — Progress Notes (Signed)
Pt is here to establish care. Pt is requesting a physical and lab work. Pt has lower back pain due from surgery in 1998 and in the past 2 years the pain is getting severe.

## 2013-08-15 NOTE — Progress Notes (Signed)
Patient ID: Carter Kassel Ohagan, male   DOB: [birth year], 57 y.o.   MRN: [id number] [facility] History and Physical  Ralphael Southgate Mingle UYQ:[id number] DOB: [birth year] DOA: (Not on file)  PCP: Jeanann Lewandowsky, MD   Chief Complaint: Established care  HPI:  57 year old male with chronic low back pain from lumbar disc degeneration for last 15 years comes in to establish care. As previously followed by Dr. Darryll Capers and now wishes to establish care here as he has lost his insurance. Patient reports taking Flexeril for his back spasms and that does help with the symptoms but PT has gotten worse and has difficulty with his job. He has applied for disability but was rejected and would like to try again. He  requests for a full physical and also to have his prostate checked. He denies any headache, blurred vision, dizziness, fever, chills, nausea, vomiting, chest pain, palpitations, shortness of breath, abdominal pain, bowel or urinary symptoms, joint pains. He reports back pain radiating down the left thigh. He denies any change in his weight or appetite.  Review of Systems:     Past Medical History  Diagnosis Date  . Depression   . Diverticulosis of colon   . Hyperlipidemia    Past Surgical History  Procedure Laterality Date  . Lumbar disc surgery     Social History:  reports that he has never smoked. He does not have any smokeless tobacco history on file. He reports that he does not drink alcohol or use illicit drugs.  No Known Allergies  Family History  Problem Relation Age of Onset  . COPD Mother   . Cancer Father     Prior to Admission medications   Medication Sig Start Date End Date Taking? Authorizing Provider  cyclobenzaprine (FLEXERIL) 5 MG tablet TAKE TWO TABLETS BY MOUTH THREE TIMES DAILY AS NEEDED FOR FOR MUSCLE SPASM 05/23/13  Yes Stacie Glaze, MD  cyclobenzaprine (FLEXERIL) 5 MG tablet TAKE 2 TABLETS BY MOUTH THREE TIMES DAILY AS NEEDED FOR MUSCLE SPASMS  08/11/13  Yes Stacie Glaze, MD  cyclobenzaprine (FLEXERIL) 5 MG tablet Take 2 tablets (10 mg total) by mouth 3 (three) times daily as needed. Muscle spasms 08/15/13  Yes Belenda Alviar, MD  DULoxetine (CYMBALTA) 60 MG capsule TAKE 1 CAPSULE (60 MG TOTAL) BY MOUTH EVERY MORNING. 07/09/13  Yes Stacie Glaze, MD  pravastatin (PRAVACHOL) 40 MG tablet Take 1 tablet (40 mg total) by mouth every morning. 08/15/13  Yes Manju Kulkarni, MD  traMADol (ULTRAM) 50 MG tablet TAKE ONE TABLET BY MOUTH EVERY 6 HOURS AS NEEDED FOR PAIN 01/06/13  Yes Stacie Glaze, MD  traMADol (ULTRAM) 50 MG tablet TAKE ONE TABLET BY MOUTH EVERY 8 HOURS AS NEEDED FOR PAIN 06/12/13  Yes Stacie Glaze, MD  traZODone (DESYREL) 100 MG tablet TAKE 1 TABLET BY MOUTH EVERY NIGHT AT BEDTIME 08/11/13  Yes Stacie Glaze, MD  omeprazole (PRILOSEC) 20 MG capsule Take 1 capsule (20 mg total) by mouth daily. 08/15/13   Chenay Nesmith, MD    Physical Exam:  Filed Vitals:   08/15/13 1717  BP: 118/76  Pulse: 67  Temp: 98.5 F (36.9 C)  TempSrc: Oral  Resp: 16  Height: 5' 8.5" (1.74 m)  Weight: 192 lb (87.091 kg)  SpO2: 94%    Constitutional: Vital signs reviewed. Middle aged male in no acute distress HEENT: No pallor, no icterus, moist oral mucosa, no cervical lymphadenopathy Cardiovascular: RRR, S1 normal, S2 normal, no MRG, pulses  symmetric and intact bilaterally Pulmonary/Chest: CTAB, no wheezes, rales, or rhonchi Abdominal: Soft. Non-tender, non-distended, bowel sounds are normal, no masses, organomegaly, or guarding present.  Rectal exam: Mildly enlarged firm prostate, mobile, nontender, no nodularity  extremities: Warm, no edema  CNS: AAO x3   Labs   Assessment/Plan Chronic low back pain I will prescribe him Flexeril 5 mg 3 times a day as needed for worsened back pain with refills  Health maintainance Check CBC, basic metabolic panel, lipid panel and A1c peaked Refused flu vaccine. We'll refer to GI for colonoscopy  daily next visit.  Dyslipidemia Continue statin. Check lipid panel  Followup in 3 months   Liza Czerwinski     08/15/2013, 5:56 PM

## 2013-08-19 ENCOUNTER — Ambulatory Visit: Payer: Medicaid Other | Attending: Internal Medicine

## 2013-08-19 DIAGNOSIS — M545 Low back pain: Secondary | ICD-10-CM

## 2013-08-19 LAB — CBC
HCT: 40.2 % (ref 39.0–52.0)
Hemoglobin: 13.5 g/dL (ref 13.0–17.0)
MCH: 29.3 pg (ref 26.0–34.0)
MCHC: 33.6 g/dL (ref 30.0–36.0)
MCV: 87.4 fL (ref 78.0–100.0)
RBC: 4.6 MIL/uL (ref 4.22–5.81)

## 2013-08-19 LAB — BASIC METABOLIC PANEL
BUN: 10 mg/dL (ref 6–23)
CO2: 29 mEq/L (ref 19–32)
Calcium: 9.3 mg/dL (ref 8.4–10.5)
Creat: 0.85 mg/dL (ref 0.50–1.35)
Glucose, Bld: 90 mg/dL (ref 70–99)

## 2013-08-19 LAB — LIPID PANEL
Cholesterol: 134 mg/dL (ref 0–200)
Total CHOL/HDL Ratio: 4.2 Ratio

## 2013-08-19 LAB — HEMOGLOBIN A1C
Hgb A1c MFr Bld: 5.5 % (ref <5.7)
Mean Plasma Glucose: 111 mg/dL (ref <117)

## 2013-08-19 MED ORDER — DULOXETINE HCL 60 MG PO CPEP: 60.0000 mg | ORAL_CAPSULE | Freq: Every day | ORAL | Status: DC

## 2013-08-19 MED ORDER — TRAZODONE HCL 100 MG PO TABS: 100.0000 mg | ORAL_TABLET | Freq: Every evening | ORAL | Status: DC | PRN

## 2013-08-19 MED ORDER — TRAZODONE HCL 100 MG PO TABS: 100.0000 mg | ORAL_TABLET | Freq: Every day | ORAL | Status: DC

## 2013-08-22 ENCOUNTER — Other Ambulatory Visit: Payer: Self-pay | Admitting: Internal Medicine

## 2013-08-26 NOTE — Telephone Encounter (Addendum)
Pt needs refill on tramadol. Pt is out please call pt after 245 on cell if need be.

## 2013-09-04 ENCOUNTER — Other Ambulatory Visit: Payer: Self-pay | Admitting: Internal Medicine

## 2013-09-09 ENCOUNTER — Telehealth: Payer: Self-pay | Admitting: Internal Medicine

## 2013-09-09 NOTE — Telephone Encounter (Signed)
Pt called regarding a refill of his medication DULoxetine (CYMBALTA) 60 MG, please contact pt

## 2013-09-23 ENCOUNTER — Telehealth: Payer: Self-pay | Admitting: Internal Medicine

## 2013-09-23 ENCOUNTER — Telehealth: Payer: Self-pay | Admitting: Emergency Medicine

## 2013-09-23 NOTE — Telephone Encounter (Signed)
Left message with pt to call clinic for negative lab results

## 2013-09-23 NOTE — Telephone Encounter (Signed)
Pt says he had physical last month and was not called about results for lab work.  Please f/u with pt.

## 2013-09-26 ENCOUNTER — Telehealth: Payer: Self-pay | Admitting: Emergency Medicine

## 2013-09-26 NOTE — Telephone Encounter (Signed)
Pt given negative lab results 

## 2013-10-02 ENCOUNTER — Other Ambulatory Visit: Payer: Self-pay | Admitting: Internal Medicine

## 2013-10-08 ENCOUNTER — Other Ambulatory Visit: Payer: Self-pay | Admitting: Internal Medicine

## 2013-10-09 ENCOUNTER — Other Ambulatory Visit: Payer: Self-pay | Admitting: Internal Medicine

## 2013-10-10 ENCOUNTER — Telehealth: Payer: Self-pay | Admitting: Emergency Medicine

## 2013-10-10 ENCOUNTER — Other Ambulatory Visit: Payer: Self-pay | Admitting: Internal Medicine

## 2013-10-10 DIAGNOSIS — M545 Low back pain, unspecified: Secondary | ICD-10-CM

## 2013-10-10 MED ORDER — PRAVASTATIN SODIUM 40 MG PO TABS: 40.0000 mg | ORAL_TABLET | Freq: Every day | ORAL | Status: DC

## 2013-10-10 MED ORDER — CYCLOBENZAPRINE HCL 5 MG PO TABS: 5.0000 mg | ORAL_TABLET | Freq: Three times a day (TID) | ORAL | Status: DC | PRN

## 2013-10-19 ENCOUNTER — Encounter (HOSPITAL_COMMUNITY): Payer: Self-pay | Admitting: Emergency Medicine

## 2013-10-19 ENCOUNTER — Emergency Department (HOSPITAL_COMMUNITY)
Admission: EM | Admit: 2013-10-19 | Discharge: 2013-10-19 | Disposition: A | Discharge status: Home / Self Care | Payer: Medicaid Other | Attending: Emergency Medicine | Admitting: Emergency Medicine

## 2013-10-19 DIAGNOSIS — Z79899 Other long term (current) drug therapy: Secondary | ICD-10-CM | POA: Insufficient documentation

## 2013-10-19 DIAGNOSIS — F3289 Other specified depressive episodes: Secondary | ICD-10-CM | POA: Insufficient documentation

## 2013-10-19 DIAGNOSIS — E785 Hyperlipidemia, unspecified: Secondary | ICD-10-CM | POA: Insufficient documentation

## 2013-10-19 DIAGNOSIS — R51 Headache: Secondary | ICD-10-CM | POA: Insufficient documentation

## 2013-10-19 DIAGNOSIS — H538 Other visual disturbances: Secondary | ICD-10-CM | POA: Insufficient documentation

## 2013-10-19 DIAGNOSIS — F329 Major depressive disorder, single episode, unspecified: Secondary | ICD-10-CM | POA: Insufficient documentation

## 2013-10-19 DIAGNOSIS — R519 Headache, unspecified: Secondary | ICD-10-CM

## 2013-10-19 DIAGNOSIS — Z8719 Personal history of other diseases of the digestive system: Secondary | ICD-10-CM | POA: Insufficient documentation

## 2013-10-19 MED ORDER — SODIUM CHLORIDE 0.9 % IV BOLUS (SEPSIS)
1000.0000 mL | Freq: Once | INTRAVENOUS | Status: AC
  Administered 2013-10-19: 1000 mL via INTRAVENOUS

## 2013-10-19 MED ORDER — DEXAMETHASONE SODIUM PHOSPHATE 10 MG/ML IJ SOLN
10.0000 mg | Freq: Once | INTRAMUSCULAR | Status: AC
  Administered 2013-10-19: 10 mg via INTRAVENOUS
  Filled 2013-10-19: qty 1

## 2013-10-19 MED ORDER — METOCLOPRAMIDE HCL 5 MG/ML IJ SOLN
10.0000 mg | Freq: Once | INTRAMUSCULAR | Status: AC
  Administered 2013-10-19: 10 mg via INTRAVENOUS
  Filled 2013-10-19: qty 2

## 2013-10-19 MED ORDER — DIPHENHYDRAMINE HCL 50 MG/ML IJ SOLN
25.0000 mg | Freq: Once | INTRAMUSCULAR | Status: AC
  Administered 2013-10-19: 25 mg via INTRAVENOUS
  Filled 2013-10-19: qty 1

## 2013-10-19 NOTE — ED Notes (Addendum)
Pt presents with c/o headache since this past Thursday. Pt says he went to the dentist on Thursday and his BP was high at their office, pt says he has no hx of HTN. Pt denies light sensitivity but sees he does feel a little bit disoriented with this headache. Pt is alert and oriented x 4 at this time. Also c/o blurred vision.

## 2013-10-19 NOTE — Discharge Instructions (Signed)

## 2013-10-19 NOTE — ED Provider Notes (Signed)
CSN: [id number]     Arrival date & time 10/19/13  1729 History   First MD Initiated Contact with Patient 10/19/13 1849     Chief Complaint  Patient presents with  . Headache     (Consider location/radiation/quality/duration/timing/severity/associated sxs/prior Treatment) Patient is a 58 y.o. male presenting with headaches.  Headache Pain location:  Frontal Quality: pressure. Radiates to:  Does not radiate Severity currently:  7/10 Onset quality:  Gradual Duration:  3 days Timing:  Constant Progression:  Waxing and waning Chronicity:  New Relieved by:  Nothing Exacerbated by: not worse with light or soudn. Ineffective treatments:  Acetaminophen Associated symptoms: blurred vision (yesterday, when headache was really bad)   Associated symptoms: no abdominal pain, no congestion, no cough, no diarrhea, no fever, no nausea, no neck stiffness, no numbness, no paresthesias, no photophobia, no vomiting and no weakness     Past Medical History  Diagnosis Date  . Depression   . Diverticulosis of colon   . Hyperlipidemia    Past Surgical History  Procedure Laterality Date  . Lumbar disc surgery     Family History  Problem Relation Age of Onset  . COPD Mother   . Cancer Father    History  Substance Use Topics  . Smoking status: Never Smoker   . Smokeless tobacco: Not on file  . Alcohol Use: No    Review of Systems  Constitutional: Negative for fever.  HENT: Negative for congestion.   Eyes: Positive for blurred vision (yesterday, when headache was really bad). Negative for photophobia.  Respiratory: Negative for cough and shortness of breath.   Cardiovascular: Negative for chest pain.  Gastrointestinal: Negative for nausea, vomiting, abdominal pain and diarrhea.  Musculoskeletal: Negative for neck stiffness.  Neurological: Positive for headaches. Negative for numbness and paresthesias.  All other systems reviewed and are negative.      Allergies  Review of  patient's allergies indicates no known allergies.  Home Medications   Current Outpatient Rx  Name  Route  Sig  Dispense  Refill  . cyclobenzaprine (FLEXERIL) 5 MG tablet   Oral   Take 1 tablet (5 mg total) by mouth 3 (three) times daily as needed for muscle spasms. Muscle spasms   60 tablet   2   . DULoxetine (CYMBALTA) 60 MG capsule   Oral   Take 1 capsule (60 mg total) by mouth daily.   30 capsule   2     No refills available   . pravastatin (PRAVACHOL) 40 MG tablet   Oral   Take 1 tablet (40 mg total) by mouth daily.   90 tablet   1   . traMADol (ULTRAM) 50 MG tablet   Oral   Take 50 mg by mouth every 6 (six) hours as needed for moderate pain.         . traZODone (DESYREL) 100 MG tablet   Oral   Take 100 mg by mouth at bedtime as needed for sleep.          BP 122/89  Pulse 95  Temp(Src) 98.4 F (36.9 C) (Oral)  Resp 14  SpO2 94% Physical Exam  Nursing note and vitals reviewed. Constitutional: He is oriented to person, place, and time. He appears well-developed and well-nourished. No distress.  HENT:  Head: Normocephalic and atraumatic.  Mouth/Throat: Oropharynx is clear and moist.  Eyes: Conjunctivae are normal. Pupils are equal, round, and reactive to light. No scleral icterus.  Neck: Neck supple.  Cardiovascular: Normal rate, regular  rhythm, normal heart sounds and intact distal pulses.   No murmur heard. Pulmonary/Chest: Effort normal and breath sounds normal. No stridor. No respiratory distress. He has no wheezes. He has no rales.  Abdominal: Soft. He exhibits no distension. There is no tenderness.  Musculoskeletal: Normal range of motion. He exhibits no edema.  Neurological: He is alert and oriented to person, place, and time. He has normal strength. No cranial nerve deficit or sensory deficit. Coordination and gait normal. GCS eye subscore is 4. GCS verbal subscore is 5. GCS motor subscore is 6.  Reflex Scores:      Patellar reflexes are 2+ on the  right side and 2+ on the left side. Skin: Skin is warm and dry. No rash noted.  Psychiatric: He has a normal mood and affect. His behavior is normal.    ED Course  Procedures (including critical care time) Labs Review Labs Reviewed - No data to display Imaging Review No results found.  EKG Interpretation   None       MDM   Final diagnoses:  Headache    Well appearing 58 yo male with frontal headache.  Improved significantly today, but then recurred.  Gradual onset.  No thunderclap, not worst of life.  No fevers or neck stiffness.  Doubt meningitis or SAH.  IV metoclopramide, diphenhydramine, dexamethasone, and fluids helped symptoms significantly.  Possibly sinus related headache.  Don't think neuro imaging would be helpful.  Plan dc home with return precautions and PCP follow up.      Candyce ChurnJohn David Cale Bethard III, MD 10/19/13 2219

## 2013-10-23 IMAGING — CT CT NECK W/ CM
2 of 3 series · 8 of 14 positions shown, 9 images · IV contrast (omnipaque)
Comparison: None.

CLINICAL DATA: 56-year-old male with neck pain and swelling.
Possible spider bite.

CT NECK WITH CONTRAST
TECHNIQUE: Multidetector CT imaging of the neck was performed with
intravenous contrast.
Contrast: 100mL OMNIPAQUE IOHEXOL 300 MG/ML  SOLN

[Series 3: neck with st · axial · 0.46mm/px · z∈[-267,-141]mm · 4 of 105 slices shown]
[im 21/105  bone]
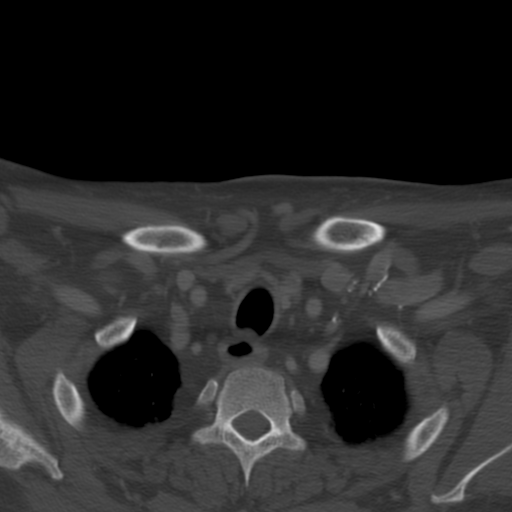
[im 42/105  bone]
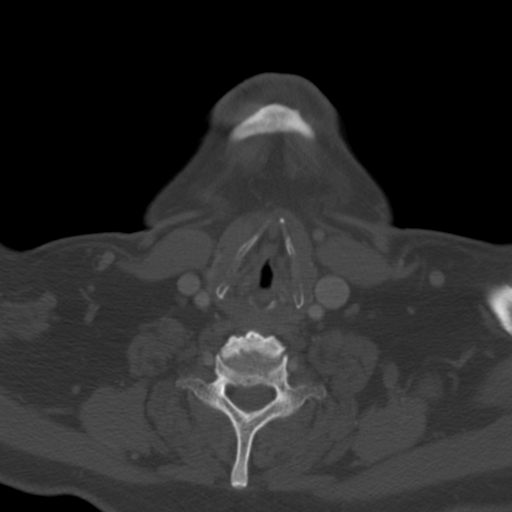
[im 63/105  bone]
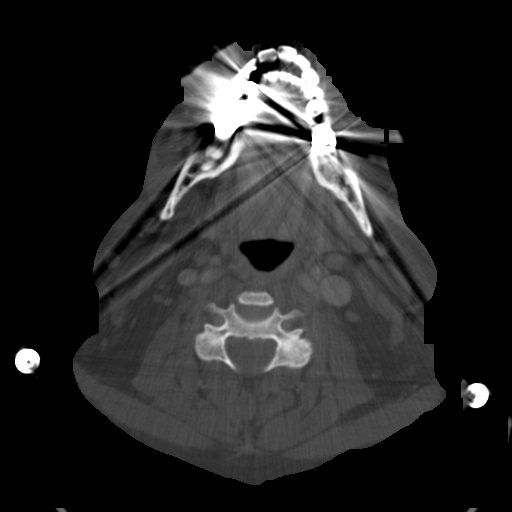
[im 84/105  bone]
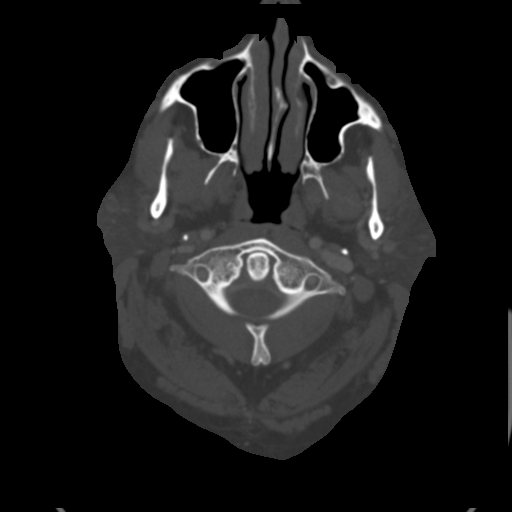

[Series 6: axial recons · axial · 0.39mm/px · z∈[-309,-176]mm · 4 of 115 slices shown, 5 images]
[im 23/115  soft-tissue]
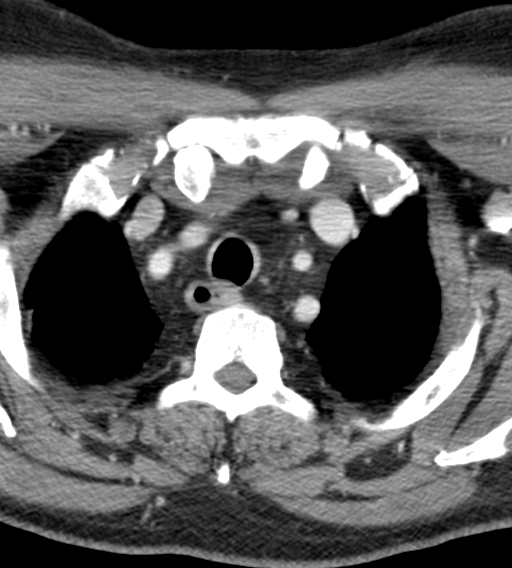
[im 23/115  bone]
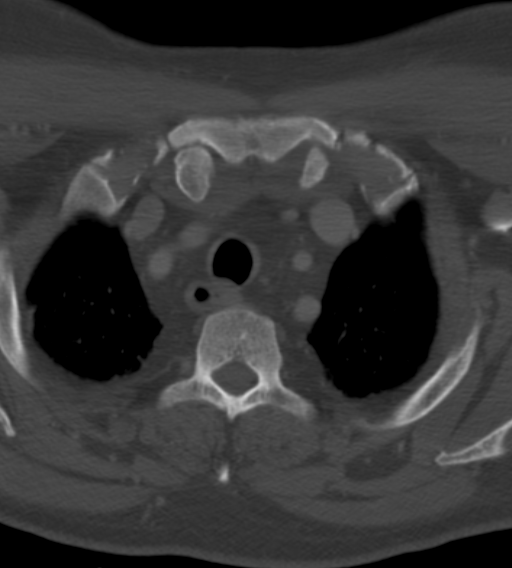
[im 46/115  bone]
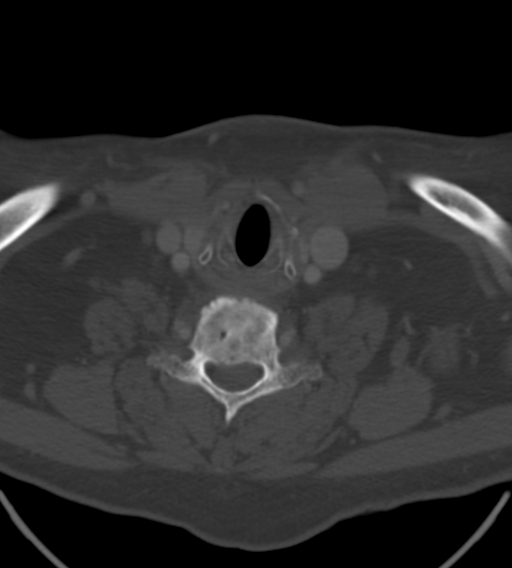
[im 69/115  bone]
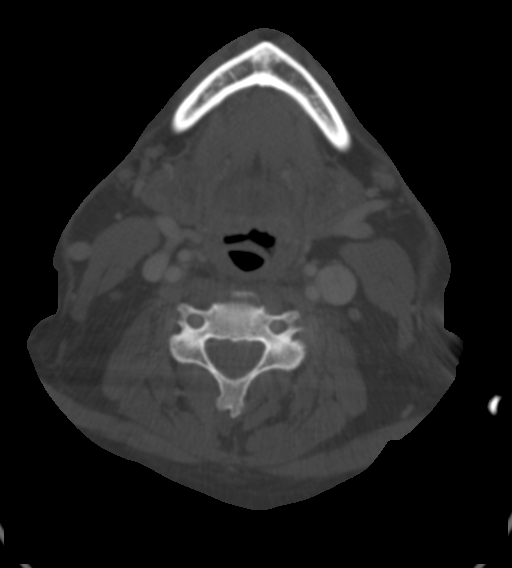
[im 92/115  bone]
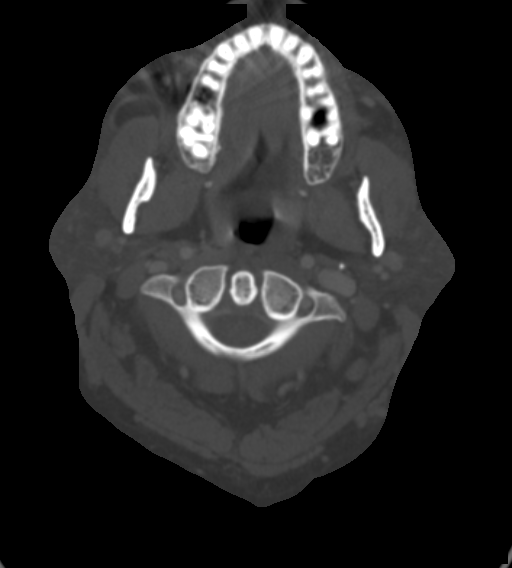

[8 of 14 positions shown; findings below may reference images not displayed]

FINDINGS: Large body habitus.  Lung apices are clear.  Superior
mediastinal lipomatosis.  Thickening of the upper thoracic
esophagus.  No surrounding stranding.  No superior mediastinal
lymphadenopathy.

Motion artifact at the level of the oropharynx and hypopharynx.
Nasopharynx within normal limits.  Parapharyngeal spaces are
normal.  Retropharyngeal space appears normal.  Lower hypopharynx
and larynx are within normal limits.  Thyroid within normal limits.
Normal epiglottis.  Sublingual space, submandibular glands and
parotid glands are within normal limits.

Visualized orbit soft tissues are within normal limits.  Negative
visualized brain parenchyma.  Visualized major vascular structures
are patent.  Mild atherosclerosis.

No cervical lymphadenopathy.  No focal fat stranding or
inflammatory changes identified in the neck.

Visualized paranasal sinuses and mastoids are clear.  Degenerative
changes in the cervical spine. No acute osseous abnormality
identified.  Incidental torus mandibularis.
IMPRESSION: 1.  Motion artifact at the level of the oropharynx, but no
inflammatory changes, mass or lymphadenopathy identified in the
neck.
2.  Mild circumferential thickening of the upper thoracic
esophagus, query mild esophagitis, or this could be artifactual.

## 2013-11-06 ENCOUNTER — Other Ambulatory Visit: Payer: Self-pay | Admitting: Internal Medicine

## 2013-11-13 ENCOUNTER — Encounter: Payer: Self-pay | Admitting: Internal Medicine

## 2013-11-13 ENCOUNTER — Ambulatory Visit: Payer: Medicaid Other | Attending: Internal Medicine | Admitting: Internal Medicine

## 2013-11-13 VITALS — BP 120/76 | HR 85 | Temp 98.6°F | Resp 16 | Ht 69.0 in | Wt 196.0 lb

## 2013-11-13 DIAGNOSIS — F3289 Other specified depressive episodes: Secondary | ICD-10-CM | POA: Insufficient documentation

## 2013-11-13 DIAGNOSIS — M533 Sacrococcygeal disorders, not elsewhere classified: Secondary | ICD-10-CM

## 2013-11-13 DIAGNOSIS — E785 Hyperlipidemia, unspecified: Secondary | ICD-10-CM | POA: Insufficient documentation

## 2013-11-13 DIAGNOSIS — K5730 Diverticulosis of large intestine without perforation or abscess without bleeding: Secondary | ICD-10-CM | POA: Insufficient documentation

## 2013-11-13 DIAGNOSIS — M545 Low back pain, unspecified: Secondary | ICD-10-CM

## 2013-11-13 DIAGNOSIS — Z79899 Other long term (current) drug therapy: Secondary | ICD-10-CM | POA: Insufficient documentation

## 2013-11-13 DIAGNOSIS — F1021 Alcohol dependence, in remission: Secondary | ICD-10-CM | POA: Insufficient documentation

## 2013-11-13 DIAGNOSIS — F329 Major depressive disorder, single episode, unspecified: Secondary | ICD-10-CM | POA: Insufficient documentation

## 2013-11-13 MED ORDER — PRAVASTATIN SODIUM 40 MG PO TABS: 40.0000 mg | ORAL_TABLET | Freq: Every day | ORAL | Status: DC

## 2013-11-13 MED ORDER — CYCLOBENZAPRINE HCL 5 MG PO TABS: 5.0000 mg | ORAL_TABLET | Freq: Three times a day (TID) | ORAL | Status: DC | PRN

## 2013-11-13 MED ORDER — DULOXETINE HCL 60 MG PO CPEP
60.0000 mg | ORAL_CAPSULE | Freq: Every day | ORAL | Status: DC
Start: 2013-11-13 — End: 2014-02-18

## 2013-11-13 MED ORDER — TRAMADOL HCL 50 MG PO TABS: 50.0000 mg | ORAL_TABLET | Freq: Four times a day (QID) | ORAL | Status: DC | PRN

## 2013-11-13 NOTE — Progress Notes (Signed)
Pt is here f/u chronic pain with med refill Requesting list of pain facilities that take Medicaid

## 2013-11-29 NOTE — Progress Notes (Signed)
Patient ID: Marvin Clark, male   DOB: 1956/08/28, 58 y.o.   MRN: [id number]   CC: follow up  HPI: Pt is 58 yo male who presents to clinic for follow up. Denies chest pain or shortness of breath, no abdominal or urinary concerns.   No Known Allergies Past Medical History  Diagnosis Date  . Depression   . Diverticulosis of colon   . Hyperlipidemia    Current Outpatient Prescriptions on File Prior to Visit  Medication Sig Dispense Refill  . traZODone (DESYREL) 100 MG tablet Take 100 mg by mouth at bedtime as needed for sleep.       No current facility-administered medications on file prior to visit.   Family History  Problem Relation Age of Onset  . COPD Mother   . Cancer Father    History   Social History  . Marital Status: Divorced    Spouse Name: N/A    Number of Children: N/A  . Years of Education: N/A   Occupational History  . Not on file.   Social History Main Topics  . Smoking status: Never Smoker   . Smokeless tobacco: Not on file  . Alcohol Use: No  . Drug Use: No  . Sexual Activity: Yes   Other Topics Concern  . Not on file   Social History Narrative  . No narrative on file    Review of Systems  Constitutional: Negative for fever, chills, diaphoresis, activity change, appetite change and fatigue.  HENT: Negative for ear pain, nosebleeds, congestion, facial swelling, rhinorrhea, neck pain, neck stiffness and ear discharge.   Eyes: Negative for pain, discharge, redness, itching and visual disturbance.  Respiratory: Negative for cough, choking, chest tightness, shortness of breath, wheezing and stridor.   Cardiovascular: Negative for chest pain, palpitations and leg swelling.  Gastrointestinal: Negative for abdominal distention.  Genitourinary: Negative for dysuria, urgency, frequency, hematuria, flank pain, decreased urine volume, difficulty urinating and dyspareunia.  Musculoskeletal: Negative for back pain, joint swelling, arthralgias and gait  problem.  Neurological: Negative for dizziness, tremors, seizures, syncope, facial asymmetry, speech difficulty, weakness, light-headedness, numbness and headaches.  Hematological: Negative for adenopathy. Does not bruise/bleed easily.  Psychiatric/Behavioral: Negative for hallucinations, behavioral problems, confusion, dysphoric mood, decreased concentration and agitation.    Objective:   Filed Vitals:   11/13/13 1647  BP: 120/76  Pulse: 85  Temp: 98.6 F (37 C)  Resp: 16    Physical Exam  Constitutional: Appears well-developed and well-nourished. No distress.  CVS: RRR, S1/S2 +, no murmurs, no gallops, no carotid bruit.  Pulmonary: Effort and breath sounds normal, no stridor, rhonchi, wheezes, rales.  Abdominal: Soft. BS +,  no distension, tenderness, rebound or guarding.   Lab Results  Component Value Date   WBC 8.1 08/19/2013   HGB 13.5 08/19/2013   HCT 40.2 08/19/2013   MCV 87.4 08/19/2013   PLT 247 08/19/2013   Lab Results  Component Value Date   CREATININE 0.85 08/19/2013   BUN 10 08/19/2013   NA 139 08/19/2013   K 3.8 08/19/2013   CL 100 08/19/2013   CO2 29 08/19/2013    Lab Results  Component Value Date   HGBA1C 5.5 08/19/2013   Lipid Panel     Component Value Date/Time   CHOL 134 08/19/2013 1641   TRIG 200* 08/19/2013 1641   HDL 32* 08/19/2013 1641   CHOLHDL 4.2 08/19/2013 1641   VLDL 40 08/19/2013 1641   LDLCALC 62 08/19/2013 1641  Assessment and plan:   Patient Active Problem List   Diagnosis Date Noted  . History of alcohol dependence - denies recent use, last time used last year 02/01/2011  . HYPERLIPIDEMIA - pt refusing blood work today, wants to have it done next time, dietary recommendations provided  07/08/2007  . DEPRESSION - stable and at baseline  07/08/2007

## 2013-11-29 NOTE — Patient Instructions (Signed)

## 2013-12-10 ENCOUNTER — Telehealth: Payer: Self-pay | Admitting: Internal Medicine

## 2013-12-10 ENCOUNTER — Other Ambulatory Visit: Payer: Self-pay | Admitting: Emergency Medicine

## 2013-12-10 NOTE — Telephone Encounter (Signed)
Pt says [facility] on [address] needs MCD authorization for Cymbalta. Please f/u with pharmacy and pt.

## 2013-12-10 NOTE — Telephone Encounter (Signed)
Spoke with pharmacy in regards to pt script for Cymbalta approval. Medicaid approved for Brand name. Left message for pt to call clinic for update

## 2013-12-11 ENCOUNTER — Telehealth: Payer: Self-pay | Admitting: Internal Medicine

## 2013-12-11 NOTE — Telephone Encounter (Signed)
Pt called returning nurse Jill's phone call, pt states that he needs a pre-Authorization on his medication CYMBALTA, medicaid is needing preauth

## 2013-12-25 ENCOUNTER — Other Ambulatory Visit: Payer: Self-pay | Admitting: Internal Medicine

## 2014-02-18 ENCOUNTER — Ambulatory Visit: Payer: Medicaid Other | Attending: Internal Medicine | Admitting: Internal Medicine

## 2014-02-18 ENCOUNTER — Encounter: Payer: Self-pay | Admitting: Internal Medicine

## 2014-02-18 VITALS — BP 112/72 | HR 89 | Temp 98.3°F | Resp 16 | Wt 191.0 lb

## 2014-02-18 DIAGNOSIS — E785 Hyperlipidemia, unspecified: Secondary | ICD-10-CM | POA: Diagnosis not present

## 2014-02-18 DIAGNOSIS — K625 Hemorrhage of anus and rectum: Secondary | ICD-10-CM

## 2014-02-18 DIAGNOSIS — F329 Major depressive disorder, single episode, unspecified: Secondary | ICD-10-CM | POA: Diagnosis not present

## 2014-02-18 DIAGNOSIS — Z760 Encounter for issue of repeat prescription: Secondary | ICD-10-CM | POA: Insufficient documentation

## 2014-02-18 DIAGNOSIS — K644 Residual hemorrhoidal skin tags: Secondary | ICD-10-CM | POA: Insufficient documentation

## 2014-02-18 DIAGNOSIS — F3289 Other specified depressive episodes: Secondary | ICD-10-CM | POA: Insufficient documentation

## 2014-02-18 DIAGNOSIS — M533 Sacrococcygeal disorders, not elsewhere classified: Secondary | ICD-10-CM

## 2014-02-18 DIAGNOSIS — K5730 Diverticulosis of large intestine without perforation or abscess without bleeding: Secondary | ICD-10-CM | POA: Diagnosis not present

## 2014-02-18 DIAGNOSIS — M545 Low back pain, unspecified: Secondary | ICD-10-CM

## 2014-02-18 MED ORDER — PRAVASTATIN SODIUM 40 MG PO TABS: 40.0000 mg | ORAL_TABLET | Freq: Every day | ORAL | Status: DC

## 2014-02-18 MED ORDER — CYCLOBENZAPRINE HCL 5 MG PO TABS: 5.0000 mg | ORAL_TABLET | Freq: Three times a day (TID) | ORAL | Status: DC | PRN

## 2014-02-18 MED ORDER — TRAZODONE HCL 100 MG PO TABS: 100.0000 mg | ORAL_TABLET | Freq: Every evening | ORAL | Status: DC | PRN

## 2014-02-18 MED ORDER — DULOXETINE HCL 60 MG PO CPEP: 60.0000 mg | ORAL_CAPSULE | Freq: Every day | ORAL | Status: DC

## 2014-02-18 MED ORDER — TRAMADOL HCL 50 MG PO TABS: 50.0000 mg | ORAL_TABLET | Freq: Four times a day (QID) | ORAL | Status: DC | PRN

## 2014-02-18 NOTE — Progress Notes (Signed)
Patient ID: Marvin Clark, male   DOB: 05-Apr-1956, 58 y.o.   MRN: [id number]  CC: Requesting refill  HPI: Patient is 58 year old male who presents to clinic requesting refill on medications. He reports having several episodes of bright red blood per rectum one week prior to this visit. This is now resolved. He reports history of hemorrhoids. He denies chest pain or shortness of breath, no specific abdominal or urinary concerns. He claims he has had similar episodes in the past, last one being 5 years ago.  No Known Allergies Past Medical History  Diagnosis Date  . Depression   . Diverticulosis of colon   . Hyperlipidemia    No current outpatient prescriptions on file prior to visit.   No current facility-administered medications on file prior to visit.   Family History  Problem Relation Age of Onset  . COPD Mother   . Cancer Father    History   Social History  . Marital Status: Divorced    Spouse Name: N/A    Number of Children: N/A  . Years of Education: N/A   Occupational History  . Not on file.   Social History Main Topics  . Smoking status: Never Smoker   . Smokeless tobacco: Not on file  . Alcohol Use: No  . Drug Use: No  . Sexual Activity: Yes   Other Topics Concern  . Not on file   Social History Narrative  . No narrative on file    Review of Systems  Constitutional: Negative for fever, chills, diaphoresis, activity change, appetite change and fatigue.  HENT: Negative for ear pain, nosebleeds, congestion, facial swelling, rhinorrhea, neck pain, neck stiffness and ear discharge.   Eyes: Negative for pain, discharge, redness, itching and visual disturbance.  Respiratory: Negative for cough, choking, chest tightness, shortness of breath, wheezing and stridor.   Cardiovascular: Negative for chest pain, palpitations and leg swelling.  Gastrointestinal: Negative for abdominal distention.  Genitourinary: Negative for dysuria, urgency, frequency, hematuria,  flank pain, decreased urine volume, difficulty urinating and dyspareunia.  Musculoskeletal: Negative for back pain, joint swelling, arthralgias and gait problem.  Neurological: Negative for dizziness, tremors, seizures, syncope, facial asymmetry, speech difficulty, weakness, light-headedness, numbness and headaches.  Hematological: Negative for adenopathy. Does not bruise/bleed easily.  Psychiatric/Behavioral: Negative for hallucinations, behavioral problems, confusion, dysphoric mood, decreased concentration and agitation.    Objective:   Filed Vitals:   02/18/14 1438  BP: 112/72  Pulse: 89  Temp: 98.3 F (36.8 C)  Resp: 16    Physical Exam  Constitutional: Appears well-developed and well-nourished. No distress.  CVS: RRR, S1/S2 +, no murmurs, no gallops, no carotid bruit.  Pulmonary: Effort and breath sounds normal, no stridor, rhonchi, wheezes, rales.  Abdominal: Soft. BS +,  no distension, tenderness, rebound or guarding.    Lab Results  Component Value Date   WBC 8.1 08/19/2013   HGB 13.5 08/19/2013   HCT 40.2 08/19/2013   MCV 87.4 08/19/2013   PLT 247 08/19/2013   Lab Results  Component Value Date   CREATININE 0.85 08/19/2013   BUN 10 08/19/2013   NA 139 08/19/2013   K 3.8 08/19/2013   CL 100 08/19/2013   CO2 29 08/19/2013    Lab Results  Component Value Date   HGBA1C 5.5 08/19/2013   Lipid Panel     Component Value Date/Time   CHOL 134 08/19/2013 1641   TRIG 200* 08/19/2013 1641   HDL 32* 08/19/2013 1641   CHOLHDL 4.2 08/19/2013 1641  VLDL 40 08/19/2013 1641   LDLCALC 62 08/19/2013 1641       Assessment and plan:   Patient Active Problem List   Diagnosis Date Noted  . HYPERLIPIDEMIA - Stable, LDL at target range, provide refill on stat  07/08/2007  . DEPRESSION - Clinically stable, patient very pleasant and with no concerns today  07/08/2007  . EXTERNAL HEMORRHOIDS - Discussed importance of regular bowel movements, fiber intake daily   07/08/2007

## 2014-02-18 NOTE — Patient Instructions (Signed)
Diverticulosis Diverticulosis is the condition that develops when small pouches (diverticula) form in the wall of your colon. Your colon, or large intestine, is where water is absorbed and stool is formed. The pouches form when the inside layer of your colon pushes through weak spots in the outer layers of your colon. CAUSES  No one knows exactly what causes diverticulosis. RISK FACTORS  Being older than 50. Your risk for this condition increases with age. Diverticulosis is rare in people younger than 40 years. By age 80, almost everyone has it.  Eating a low-fiber diet.  Being frequently constipated.  Being overweight.  Not getting enough exercise.  Smoking.  Taking over-the-counter pain medicines, like aspirin and ibuprofen. SYMPTOMS  Most people with diverticulosis do not have symptoms. DIAGNOSIS  Because diverticulosis often has no symptoms, health care providers often discover the condition during an exam for other colon problems. In many cases, a health care Rachele Lamaster will diagnose diverticulosis while using a flexible scope to examine the colon (colonoscopy). TREATMENT  If you have never developed an infection related to diverticulosis, you may not need treatment. If you have had an infection before, treatment may include:  Eating more fruits, vegetables, and grains.  Taking a fiber supplement.  Taking a live bacteria supplement (probiotic).  Taking medicine to relax your colon. HOME CARE INSTRUCTIONS   Drink at least 6-8 glasses of water each day to prevent constipation.  Try not to strain when you have a bowel movement.  Keep all follow-up appointments. If you have had an infection before:  Increase the fiber in your diet as directed by your health care Nella Botsford or dietitian.  Take a dietary fiber supplement if your health care Leland Staszewski approves.  Only take medicines as directed by your health care Tyisha Cressy. SEEK MEDICAL CARE IF:   You have abdominal  pain.  You have bloating.  You have cramps.  You have not gone to the bathroom in 3 days. SEEK IMMEDIATE MEDICAL CARE IF:   Your pain gets worse.  Yourbloating becomes very bad.  You have a fever or chills, and your symptoms suddenly get worse.  You begin vomiting.  You have bowel movements that are bloody or black. MAKE SURE YOU:  Understand these instructions.  Will watch your condition.  Will get help right away if you are not doing well or get worse. Document Released: 05/18/2004 Document Revised: 08/26/2013 Document Reviewed: 07/16/2013 Exit[employer] Patient Information 2015 [employer]. This information is not intended to replace advice given to you by your health care Aleene Swanner. Make sure you discuss any questions you have with your health care Jasime Westergren.  

## 2014-02-18 NOTE — Progress Notes (Signed)
Patient here for follow up on his back pain

## 2014-03-11 ENCOUNTER — Telehealth: Payer: Self-pay | Admitting: Internal Medicine

## 2014-03-11 NOTE — Telephone Encounter (Signed)
Pt.'s pharmacy sent over a refill request for Cymbalta on Tuesday 03/11/14 for medicaid approval...Marland Kitchen.Marland Kitchen.Patient is completely out of medication. Please call patient as soon as possible.

## 2014-03-16 NOTE — Telephone Encounter (Signed)
Returned patient's call. No answer left a message for patient to return our call.

## 2014-04-22 ENCOUNTER — Other Ambulatory Visit: Payer: Self-pay | Admitting: Gastroenterology

## 2014-05-06 ENCOUNTER — Encounter (HOSPITAL_COMMUNITY): Admission: RE | Payer: Self-pay | Source: Ambulatory Visit

## 2014-05-06 ENCOUNTER — Ambulatory Visit (HOSPITAL_COMMUNITY): Admission: RE | Admit: 2014-05-06 | Payer: Medicaid Other | Source: Ambulatory Visit | Admitting: Gastroenterology

## 2014-05-06 SURGERY — COLONOSCOPY WITH PROPOFOL
Anesthesia: Monitor Anesthesia Care

## 2014-05-13 ENCOUNTER — Ambulatory Visit: Payer: Medicaid Other | Admitting: Internal Medicine

## 2014-08-10 ENCOUNTER — Other Ambulatory Visit: Payer: Self-pay | Admitting: Internal Medicine

## 2014-09-07 ENCOUNTER — Ambulatory Visit: Payer: Medicaid Other | Attending: Family Medicine | Admitting: Family Medicine

## 2014-09-07 ENCOUNTER — Encounter: Payer: Self-pay | Admitting: Family Medicine

## 2014-09-07 VITALS — BP 103/65 | HR 79 | Temp 98.6°F | Resp 16 | Ht 69.0 in | Wt 191.0 lb

## 2014-09-07 DIAGNOSIS — N3943 Post-void dribbling: Secondary | ICD-10-CM

## 2014-09-07 DIAGNOSIS — M545 Low back pain: Secondary | ICD-10-CM | POA: Diagnosis not present

## 2014-09-07 DIAGNOSIS — E7800 Pure hypercholesterolemia, unspecified: Secondary | ICD-10-CM

## 2014-09-07 DIAGNOSIS — M5137 Other intervertebral disc degeneration, lumbosacral region: Secondary | ICD-10-CM

## 2014-09-07 DIAGNOSIS — N400 Enlarged prostate without lower urinary tract symptoms: Secondary | ICD-10-CM | POA: Insufficient documentation

## 2014-09-07 DIAGNOSIS — F32A Depression, unspecified: Secondary | ICD-10-CM

## 2014-09-07 DIAGNOSIS — G8929 Other chronic pain: Secondary | ICD-10-CM | POA: Diagnosis not present

## 2014-09-07 DIAGNOSIS — N39498 Other specified urinary incontinence: Secondary | ICD-10-CM | POA: Diagnosis not present

## 2014-09-07 DIAGNOSIS — Z8042 Family history of malignant neoplasm of prostate: Secondary | ICD-10-CM | POA: Insufficient documentation

## 2014-09-07 DIAGNOSIS — F329 Major depressive disorder, single episode, unspecified: Secondary | ICD-10-CM

## 2014-09-07 LAB — POCT URINALYSIS DIPSTICK
Bilirubin, UA: NEGATIVE
Blood, UA: NEGATIVE
GLUCOSE UA: NEGATIVE
KETONES UA: NEGATIVE
LEUKOCYTES UA: NEGATIVE
Nitrite, UA: NEGATIVE
PROTEIN UA: NEGATIVE
SPEC GRAV UA: 1.015
Urobilinogen, UA: 0.2
pH, UA: 7.5

## 2014-09-07 MED ORDER — CYCLOBENZAPRINE HCL 5 MG PO TABS: 5.0000 mg | ORAL_TABLET | Freq: Three times a day (TID) | ORAL | Status: DC | PRN

## 2014-09-07 MED ORDER — TRAMADOL HCL 50 MG PO TABS: 50.0000 mg | ORAL_TABLET | Freq: Two times a day (BID) | ORAL | Status: DC | PRN

## 2014-09-07 MED ORDER — DULOXETINE HCL 60 MG PO CPEP: 60.0000 mg | ORAL_CAPSULE | Freq: Every day | ORAL | Status: DC

## 2014-09-07 MED ORDER — TRAZODONE HCL 100 MG PO TABS: 100.0000 mg | ORAL_TABLET | Freq: Every evening | ORAL | Status: DC | PRN

## 2014-09-07 NOTE — Patient Instructions (Addendum)
Mr. Ryant,  Thank you for coming in today. It was a pleasure meeting you. I look forward to being your primary doctor.   1. Low back pain: I have refilled all your medications.   2. Urine dribbling with enlarged but soft prostate: I suspect BPH PSA Stool card urinalysis We will will plan for BPH treatment if all labs normal.  F/u within one week for lab work- come fasting. You will be called with lab results.   See me in 6 months  Dr. Armen Pickup

## 2014-09-07 NOTE — Progress Notes (Signed)
Pt comes in to f/u with chronic lower back pain with pain control Pt ran out of pain meds Lower back pain,non radiating, intermit Requesting refills on all meds

## 2014-09-08 NOTE — Progress Notes (Signed)
   Subjective:    Patient ID: Ronda Fairlyicholas J Marner, male    DOB: July 10, 1956, 59 y.o.   MRN: [id number] CC: chronic low back pain, establish with new PCP HPI 59 yo M presents for f/u appt to discuss the following:  1. BACK PAIN  Location: lumbar Quality: achy Onset: 1997 Worse with: heavy lifting and bending forward Better with: rest, flexeril, tramadol Radiation: down to hips at times Trauma: no recent  Best sitting/standing/leaning forward: sitting   Red Flags Fecal/urinary incontinence: no, but has noticed dribbling at end of urine stream for past month  Numbness/Weakness: no  Fever/chills/sweats: no  Night pain: yes  Unexplained weight loss: no  No relief with bedrest: no  h/o cancer/immunosuppression: no  IV drug use: no  PMH of osteoporosis or chronic steroid use: no    Soc Hx: non smoker Fam Hx: prostate cancer in brother  Review of Systems As per HPI     Objective:   Physical Exam BP 103/65 mmHg  Pulse 79  Temp(Src) 98.6 F (37 C) (Oral)  Resp 16  Ht 5\' 9"  (1.753 m)  Wt 191 lb (86.637 kg)  BMI 28.19 kg/m2  SpO2 98% General appearance: alert, cooperative and no distress  Back Exam: Back: Normal Curvature, no deformities or CVA tenderness  Paraspinal Tenderness: absent   LE Strength 5/5  LE Sensation: in tact  LE Reflexes 2+ and symmetric  Straight leg raise: negative  Rectal: normal stone, firm stool in rectal vault, smooth, slightlyenlarged prostate, non tender       Assessment & Plan:

## 2014-09-08 NOTE — Assessment & Plan Note (Signed)
2. Urine dribbling with enlarged but soft prostate: I suspect BPH PSA Stool card Urinalysis- normal  We will will plan for BPH treatment if all labs normal.

## 2014-09-08 NOTE — Assessment & Plan Note (Signed)
A: chronic lumbar back pain. Controlled on current regimen. No red flags except urine dribbling (see problem) P:  I have refilled all your medications.

## 2014-09-15 ENCOUNTER — Other Ambulatory Visit: Payer: Medicaid Other

## 2014-09-17 ENCOUNTER — Other Ambulatory Visit: Payer: Self-pay | Admitting: Family Medicine

## 2014-09-17 ENCOUNTER — Ambulatory Visit: Payer: Medicaid Other | Attending: Family Medicine

## 2014-09-17 DIAGNOSIS — M5137 Other intervertebral disc degeneration, lumbosacral region: Secondary | ICD-10-CM

## 2014-09-17 DIAGNOSIS — N3943 Post-void dribbling: Secondary | ICD-10-CM

## 2014-09-17 DIAGNOSIS — E7800 Pure hypercholesterolemia, unspecified: Secondary | ICD-10-CM

## 2014-09-17 LAB — CBC
HCT: 46.3 % (ref 39.0–52.0)
Hemoglobin: 15.7 g/dL (ref 13.0–17.0)
MCH: 29.7 pg (ref 26.0–34.0)
MCHC: 33.9 g/dL (ref 30.0–36.0)
MCV: 87.7 fL (ref 78.0–100.0)
MPV: 9.6 fL (ref 8.6–12.4)
Platelets: 263 10*3/uL (ref 150–400)
RBC: 5.28 MIL/uL (ref 4.22–5.81)
RDW: 13.4 % (ref 11.5–15.5)
WBC: 6.4 10*3/uL (ref 4.0–10.5)

## 2014-09-17 LAB — COMPLETE METABOLIC PANEL WITH GFR
ALT: 16 U/L (ref 0–53)
AST: 14 U/L (ref 0–37)
Albumin: 3.6 g/dL (ref 3.5–5.2)
Alkaline Phosphatase: 43 U/L (ref 39–117)
BILIRUBIN TOTAL: 0.5 mg/dL (ref 0.2–1.2)
BUN: 12 mg/dL (ref 6–23)
CO2: 28 mEq/L (ref 19–32)
CREATININE: 0.69 mg/dL (ref 0.50–1.35)
Calcium: 9 mg/dL (ref 8.4–10.5)
Chloride: 104 mEq/L (ref 96–112)
GLUCOSE: 111 mg/dL — AB (ref 70–99)
POTASSIUM: 4.6 meq/L (ref 3.5–5.3)
Sodium: 139 mEq/L (ref 135–145)
Total Protein: 5.6 g/dL — ABNORMAL LOW (ref 6.0–8.3)

## 2014-09-17 LAB — LIPID PANEL
CHOL/HDL RATIO: 4.3 ratio
CHOLESTEROL: 162 mg/dL (ref 0–200)
HDL: 38 mg/dL — AB (ref >39)
LDL CALC: 99 mg/dL (ref 0–99)
TRIGLYCERIDES: 123 mg/dL (ref <150)
VLDL: 25 mg/dL (ref 0–40)

## 2014-09-17 NOTE — Telephone Encounter (Signed)
Pt is wondering why he was not prescribed refills for his cholesterol medication. Please follow up with pt.

## 2014-09-17 NOTE — Telephone Encounter (Signed)
Left voice message, pt should have 5 more refills left of Rx Pravastatin please call pharmacy for refills

## 2014-09-18 LAB — PSA: PSA: 2.11 ng/mL (ref <=4.00)

## 2014-09-21 ENCOUNTER — Telehealth: Payer: Self-pay | Admitting: *Deleted

## 2014-09-21 NOTE — Telephone Encounter (Signed)
-----   Message from Lora PaulaJosalyn C Funches, MD sent at 09/18/2014  8:52 AM EST ----- Normal CBC, Normal CMP Lipids looking good, continue statin. PSA normal

## 2014-09-21 NOTE — Telephone Encounter (Signed)
Left voice massage with normal labs, continue taking medication as prescribed If any question return call

## 2014-10-13 ENCOUNTER — Telehealth: Payer: Self-pay | Admitting: Family Medicine

## 2014-10-13 NOTE — Telephone Encounter (Signed)
Patient is calling to request to speak to nurse, patient states that Medicaid is not covering his medications and needs for his PCP's nurse to call his insurance. Patient states that this issue has happened before and his nurse called and fixed the problem. Patient states that all of his current medications are not being covered. Please f/u with patient for further questions.

## 2014-12-14 ENCOUNTER — Telehealth: Payer: Self-pay | Admitting: General Practice

## 2014-12-14 NOTE — Telephone Encounter (Signed)
Patient calling to request medication refill for Tylenol 3. Please assist

## 2014-12-15 ENCOUNTER — Other Ambulatory Visit: Payer: Self-pay | Admitting: Family Medicine

## 2014-12-15 ENCOUNTER — Telehealth: Payer: Self-pay | Admitting: Family Medicine

## 2014-12-15 DIAGNOSIS — M5137 Other intervertebral disc degeneration, lumbosacral region: Secondary | ICD-10-CM

## 2014-12-15 MED ORDER — TRAMADOL HCL 50 MG PO TABS: 50.0000 mg | ORAL_TABLET | Freq: Two times a day (BID) | ORAL | Status: DC | PRN

## 2014-12-15 NOTE — Addendum Note (Signed)
Addended by: Dessa PhiFUNCHES, Maleena Eddleman on: 12/15/2014 03:56 PM   Modules accepted: Orders

## 2014-12-15 NOTE — Telephone Encounter (Signed)
Pt. Is needing a refill for traMADol (ULTRAM) 50 MG tablet. Please follow up with pt. He has an appointment next week and is wondering if he can get a week sample to last him till his appointment.

## 2014-12-15 NOTE — Telephone Encounter (Signed)
Tramadol rx refilled ready for pick up

## 2014-12-16 NOTE — Telephone Encounter (Signed)
Pt aware.

## 2014-12-22 ENCOUNTER — Ambulatory Visit: Payer: Medicaid Other | Admitting: Family Medicine

## 2014-12-31 ENCOUNTER — Ambulatory Visit: Payer: Medicaid Other | Attending: Family Medicine | Admitting: Family Medicine

## 2014-12-31 ENCOUNTER — Encounter: Payer: Self-pay | Admitting: Family Medicine

## 2014-12-31 VITALS — BP 114/66 | HR 68 | Temp 98.3°F | Resp 16 | Ht 69.0 in | Wt 181.0 lb

## 2014-12-31 DIAGNOSIS — N400 Enlarged prostate without lower urinary tract symptoms: Secondary | ICD-10-CM

## 2014-12-31 DIAGNOSIS — N401 Enlarged prostate with lower urinary tract symptoms: Secondary | ICD-10-CM | POA: Diagnosis not present

## 2014-12-31 DIAGNOSIS — F329 Major depressive disorder, single episode, unspecified: Secondary | ICD-10-CM | POA: Diagnosis not present

## 2014-12-31 DIAGNOSIS — N3943 Post-void dribbling: Secondary | ICD-10-CM

## 2014-12-31 DIAGNOSIS — M5137 Other intervertebral disc degeneration, lumbosacral region: Secondary | ICD-10-CM

## 2014-12-31 DIAGNOSIS — R351 Nocturia: Secondary | ICD-10-CM | POA: Diagnosis not present

## 2014-12-31 DIAGNOSIS — G8929 Other chronic pain: Secondary | ICD-10-CM | POA: Insufficient documentation

## 2014-12-31 DIAGNOSIS — F32A Depression, unspecified: Secondary | ICD-10-CM

## 2014-12-31 DIAGNOSIS — M545 Low back pain: Secondary | ICD-10-CM | POA: Diagnosis present

## 2014-12-31 MED ORDER — CYCLOBENZAPRINE HCL 5 MG PO TABS: 5.0000 mg | ORAL_TABLET | Freq: Three times a day (TID) | ORAL | Status: DC | PRN

## 2014-12-31 MED ORDER — TRAMADOL HCL 50 MG PO TABS: 50.0000 mg | ORAL_TABLET | Freq: Two times a day (BID) | ORAL | Status: DC | PRN

## 2014-12-31 MED ORDER — TAMSULOSIN HCL 0.4 MG PO CAPS: 0.4000 mg | ORAL_CAPSULE | Freq: Every day | ORAL | Status: DC

## 2014-12-31 NOTE — Progress Notes (Signed)
   Subjective:    Patient ID: Marvin Clark, male    DOB: 1956/05/02, 59 y.o.   MRN: [id number] CC: 3 months f/u chronic low back pain, BPH  HPI  1. Low back pain: doing well. Controlled with tramadol and prn flexeril. Takes flexeril at nightly only due to sedation. Patient is working. No fever, fecal or urinary incontinence.  2. BPH: urinary dribbling has improved. Has nocturia 2 times a night. No dysuria.   3. Depression: taking cymbalta and trazodone. Mood is good. PHQ-2 negative.   Soc Hx: non smoker  Review of Systems  Musculoskeletal: Positive for back pain.      Objective:   Physical Exam BP 114/66 mmHg  Pulse 68  Temp(Src) 98.3 F (36.8 C) (Oral)  Resp 16  Ht 5\' 9"  (1.753 m)  Wt 181 lb (82.101 kg)  BMI 26.72 kg/m2  SpO2 97% General appearance: alert, cooperative and no distress Lungs: normal WOB        Assessment & Plan:

## 2014-12-31 NOTE — Assessment & Plan Note (Signed)
A; stable P: refilled cymbalta and trazodone

## 2014-12-31 NOTE — Patient Instructions (Addendum)
Mr. Marvin Clark,  Thank you for coming in today.   1. Chronic low back pain: Refilled tramadol Continue flexeril as needed 5-10 mg in the evening   2. BPH: start flomax 0.4 mg in the evening. Goal is to decrease nighttime urination from 2 to 1 or 0 times a day.   F/u in 3 months for chronic low back pain and BPH  Dr. Armen PickupFunches   Benign Prostatic Hyperplasia An enlarged prostate (benign prostatic hyperplasia) is common in older men. You may experience the following:  Weak urine stream.  Dribbling.  Feeling like the bladder has not emptied completely.  Difficulty starting urination.  Getting up frequently at night to urinate.  Urinating more frequently during the day. HOME CARE INSTRUCTIONS  Monitor your prostatic hyperplasia for any changes. The following actions may help to alleviate any discomfort you are experiencing:  Give yourself time when you urinate.  Stay away from alcohol.  Avoid beverages containing caffeine, such as coffee, tea, and colas, because they can make the problem worse.  Avoid decongestants, antihistamines, and some prescription medicines that can make the problem worse.  Follow up with your health care provider for further treatment as recommended. SEEK MEDICAL CARE IF:  You are experiencing progressive difficulty voiding.  Your urine stream is progressively getting narrower.  You are awaking from sleep with the urge to void more frequently.  You are constantly feeling the need to void.  You experience loss of urine, especially in small amounts. SEEK IMMEDIATE MEDICAL CARE IF:   You develop increased pain with urination or are unable to urinate.  You develop severe abdominal pain, vomiting, a high fever, or fainting.  You develop back pain or blood in your urine. MAKE SURE YOU:   Understand these instructions.  Will watch your condition.  Will get help right away if you are not doing well or get worse. Document Released: 08/21/2005  Document Revised: 04/23/2013 Document Reviewed: 01/21/2013 [facility] Patient Information 2015 [employer], [place]. This information is not intended to replace advice given to you by your health care provider. Make sure you discuss any questions you have with your health care provider.

## 2014-12-31 NOTE — Progress Notes (Signed)
Medicine refills No problems at this time

## 2014-12-31 NOTE — Assessment & Plan Note (Signed)
Chronic low back pain: Refilled tramadol Continue flexeril as needed 5-10 mg in the evening

## 2014-12-31 NOTE — Assessment & Plan Note (Signed)
BPH: start flomax 0.4 mg in the evening. Goal is to decrease nighttime urination from 2 to 1 or 0 times a day.

## 2015-02-27 ENCOUNTER — Other Ambulatory Visit: Payer: Self-pay | Admitting: Family Medicine

## 2015-03-11 ENCOUNTER — Ambulatory Visit: Payer: Medicaid Other | Admitting: Family Medicine

## 2015-03-13 ENCOUNTER — Other Ambulatory Visit: Payer: Self-pay | Admitting: Internal Medicine

## 2015-03-15 ENCOUNTER — Other Ambulatory Visit: Payer: Self-pay | Admitting: *Deleted

## 2015-03-15 ENCOUNTER — Other Ambulatory Visit: Payer: Self-pay | Admitting: General Practice

## 2015-03-15 DIAGNOSIS — F329 Major depressive disorder, single episode, unspecified: Secondary | ICD-10-CM

## 2015-03-15 DIAGNOSIS — F32A Depression, unspecified: Secondary | ICD-10-CM

## 2015-03-15 MED ORDER — DULOXETINE HCL 60 MG PO CPEP: 60.0000 mg | ORAL_CAPSULE | Freq: Every day | ORAL | Status: DC

## 2015-03-15 MED ORDER — PRAVASTATIN SODIUM 40 MG PO TABS: 40.0000 mg | ORAL_TABLET | Freq: Every day | ORAL | Status: DC

## 2015-03-15 MED ORDER — TRAZODONE HCL 100 MG PO TABS: 100.0000 mg | ORAL_TABLET | Freq: Every evening | ORAL | Status: DC | PRN

## 2015-03-15 NOTE — Telephone Encounter (Signed)
Patient calling to request medication refill for: pravastatin (PRAVACHOL) 40 MG tablet ,    DULoxetine (CYMBALTA) 60 MG capsule Please assist

## 2015-03-15 NOTE — Telephone Encounter (Signed)
Pt returning call

## 2015-03-15 NOTE — Telephone Encounter (Signed)
[facility]

## 2015-03-15 NOTE — Telephone Encounter (Signed)
Rx refill send to [facility]  Pt aware

## 2015-03-25 ENCOUNTER — Ambulatory Visit: Payer: Medicaid Other | Attending: Family Medicine | Admitting: Family Medicine

## 2015-03-25 ENCOUNTER — Encounter: Payer: Self-pay | Admitting: Family Medicine

## 2015-03-25 VITALS — BP 102/62 | HR 75 | Temp 97.6°F | Resp 18 | Ht 69.0 in | Wt 182.0 lb

## 2015-03-25 DIAGNOSIS — M545 Low back pain: Secondary | ICD-10-CM | POA: Insufficient documentation

## 2015-03-25 DIAGNOSIS — Z0000 Encounter for general adult medical examination without abnormal findings: Secondary | ICD-10-CM | POA: Insufficient documentation

## 2015-03-25 DIAGNOSIS — F32A Depression, unspecified: Secondary | ICD-10-CM

## 2015-03-25 DIAGNOSIS — M5137 Other intervertebral disc degeneration, lumbosacral region: Secondary | ICD-10-CM | POA: Diagnosis not present

## 2015-03-25 DIAGNOSIS — F329 Major depressive disorder, single episode, unspecified: Secondary | ICD-10-CM | POA: Diagnosis not present

## 2015-03-25 DIAGNOSIS — Z114 Encounter for screening for human immunodeficiency virus [HIV]: Secondary | ICD-10-CM | POA: Insufficient documentation

## 2015-03-25 MED ORDER — CYCLOBENZAPRINE HCL 5 MG PO TABS: 5.0000 mg | ORAL_TABLET | Freq: Three times a day (TID) | ORAL | Status: DC | PRN

## 2015-03-25 MED ORDER — DULOXETINE HCL 60 MG PO CPEP: 60.0000 mg | ORAL_CAPSULE | Freq: Every day | ORAL | Status: DC

## 2015-03-25 MED ORDER — TRAMADOL HCL 50 MG PO TABS: 50.0000 mg | ORAL_TABLET | Freq: Two times a day (BID) | ORAL | Status: DC | PRN

## 2015-03-25 NOTE — Assessment & Plan Note (Signed)
Screening HIV done  

## 2015-03-25 NOTE — Patient Instructions (Addendum)
Marvin Clark,  Thank you for coming in today  1. Low back pain: Tramadol Flexeril Aleve Cymbalta Avoid heavy lifting  2. Screening: HIV ordered GI referral for colonoscopy   F/u in 6 weeks for flu shot, RN visit  F/u in 4 months, sooner if needed for low back pain   Dr. Armen Pickup

## 2015-03-25 NOTE — Progress Notes (Signed)
   Subjective:    Patient ID: Marvin Clark, male    DOB: July 09, 1956, 59 y.o.   MRN: [id number] CC: f/u back pain  HPI 59 yo M presents for f/u low back pain:  1. Lumbar back pain: taking tramadol daily. 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   2. HM: due for colonoscopy. Has not had one. Has hx of BRBPR attributed to hemorrhoids. No recent bleeding or rectal pain.   Soc Hx: non smoker  Review of Systems  Constitutional: Negative for fever, chills, fatigue and unexpected weight change.  Cardiovascular: Negative for chest pain, palpitations and leg swelling.  Gastrointestinal: Negative for blood in stool.  Musculoskeletal: Positive for back pain. Negative for myalgias and gait problem.      Objective:   Physical Exam BP 102/62 mmHg  Pulse 75  Temp(Src) 97.6 F (36.4 C) (Oral)  Resp 18  Ht  (1.753 m)  Wt 182 lb (82.555 kg)  BMI 26.86 kg/m2  SpO2 96%  Wt Readings from Last 3 Encounters:  03/25/15 182 lb (82.555 kg)  12/31/14 181 lb (82.101 kg)  09/07/14 191 lb (86.637 kg)  General appearance: alert, cooperative and no distress Back Exam: Back: Normal Curvature, no deformities or CVA tenderness  Paraspinal Tenderness: b/l L5 and S1 area   LE Strength 5/5  LE Sensation: in tact  LE Reflexes 2+ and symmetric  Straight leg raise: negative      Assessment & Plan:

## 2015-03-25 NOTE — Assessment & Plan Note (Signed)
Screening: HIV ordered GI referral for colonoscopy

## 2015-03-25 NOTE — Progress Notes (Signed)
F/U back pain

## 2015-03-25 NOTE — Assessment & Plan Note (Signed)
A:Low back pain: stable and chronic.  P: Tramadol Flexeril Aleve Cymbalta Avoid heavy lifting

## 2015-03-26 ENCOUNTER — Telehealth: Payer: Self-pay | Admitting: *Deleted

## 2015-03-26 LAB — HIV ANTIBODY (ROUTINE TESTING W REFLEX): HIV: NONREACTIVE

## 2015-03-26 NOTE — Telephone Encounter (Signed)
Pt aware of results Rx Tramadol at front office

## 2015-03-26 NOTE — Telephone Encounter (Signed)
-----   Message from Dessa Phi, MD sent at 03/26/2015  1:02 PM EDT ----- Screening HIV negative

## 2015-04-30 ENCOUNTER — Encounter: Payer: Self-pay | Admitting: Family Medicine

## 2015-05-07 ENCOUNTER — Ambulatory Visit: Payer: Medicaid Other

## 2015-08-12 ENCOUNTER — Telehealth: Payer: Self-pay | Admitting: Family Medicine

## 2015-08-12 NOTE — Telephone Encounter (Signed)
Pt. Called requesting a med refill on the following medications:  traZODone (DESYREL) 100 MG tablet traMADol (ULTRAM) 50 MG tablet  Pt. Was told to call back and make an appt. In January since PCP has no appt. Available in December. Please f/u with pt.

## 2015-08-13 ENCOUNTER — Other Ambulatory Visit: Payer: Self-pay | Admitting: *Deleted

## 2015-08-13 DIAGNOSIS — M5137 Other intervertebral disc degeneration, lumbosacral region: Secondary | ICD-10-CM

## 2015-08-13 DIAGNOSIS — F32A Depression, unspecified: Secondary | ICD-10-CM

## 2015-08-13 DIAGNOSIS — F329 Major depressive disorder, single episode, unspecified: Secondary | ICD-10-CM

## 2015-08-13 MED ORDER — TRAMADOL HCL 50 MG PO TABS: 50.0000 mg | ORAL_TABLET | Freq: Two times a day (BID) | ORAL | Status: DC | PRN

## 2015-08-13 MED ORDER — TRAZODONE HCL 100 MG PO TABS: 100.0000 mg | ORAL_TABLET | Freq: Every evening | ORAL | Status: DC | PRN

## 2015-08-13 NOTE — Telephone Encounter (Signed)
Pt notified Rx at front office  

## 2015-09-23 ENCOUNTER — Other Ambulatory Visit: Payer: Self-pay | Admitting: Family Medicine

## 2015-09-23 ENCOUNTER — Encounter: Payer: Self-pay | Admitting: Family Medicine

## 2015-09-23 ENCOUNTER — Ambulatory Visit: Payer: Medicaid Other | Attending: Family Medicine | Admitting: Family Medicine

## 2015-09-23 VITALS — BP 100/61 | HR 65 | Temp 98.4°F | Resp 16 | Ht 69.0 in | Wt 173.0 lb

## 2015-09-23 DIAGNOSIS — G8929 Other chronic pain: Secondary | ICD-10-CM | POA: Diagnosis not present

## 2015-09-23 DIAGNOSIS — F32A Depression, unspecified: Secondary | ICD-10-CM

## 2015-09-23 DIAGNOSIS — Z1159 Encounter for screening for other viral diseases: Secondary | ICD-10-CM

## 2015-09-23 DIAGNOSIS — F329 Major depressive disorder, single episode, unspecified: Secondary | ICD-10-CM | POA: Insufficient documentation

## 2015-09-23 DIAGNOSIS — M545 Low back pain: Secondary | ICD-10-CM | POA: Insufficient documentation

## 2015-09-23 DIAGNOSIS — M5137 Other intervertebral disc degeneration, lumbosacral region: Secondary | ICD-10-CM

## 2015-09-23 DIAGNOSIS — N400 Enlarged prostate without lower urinary tract symptoms: Secondary | ICD-10-CM | POA: Insufficient documentation

## 2015-09-23 DIAGNOSIS — Z0000 Encounter for general adult medical examination without abnormal findings: Secondary | ICD-10-CM

## 2015-09-23 DIAGNOSIS — Z79899 Other long term (current) drug therapy: Secondary | ICD-10-CM | POA: Insufficient documentation

## 2015-09-23 LAB — POCT GLYCOSYLATED HEMOGLOBIN (HGB A1C): HEMOGLOBIN A1C: 5.3

## 2015-09-23 MED ORDER — TRAZODONE HCL 100 MG PO TABS: 100.0000 mg | ORAL_TABLET | Freq: Every evening | ORAL | Status: DC | PRN

## 2015-09-23 MED ORDER — CYCLOBENZAPRINE HCL 5 MG PO TABS: 5.0000 mg | ORAL_TABLET | Freq: Three times a day (TID) | ORAL | Status: DC | PRN

## 2015-09-23 MED ORDER — DULOXETINE HCL 60 MG PO CPEP: 60.0000 mg | ORAL_CAPSULE | Freq: Every day | ORAL | Status: DC

## 2015-09-23 NOTE — Progress Notes (Signed)
Patient ID: Marvin Clark, male   DOB: [birth year], 60 y.o.   MRN: [id number]   Subjective:  Patient ID: Marvin Clark, male    DOB: [birth year]  Age: 60 y.o. MRN: [id number]  CC: Back Pain   HPI Marvin Clark presents for   1. Chronic low back pain: comes and goes. Today is a good day. Medicines help.   2. Depression: cymbalta helps. No ETOH. No SI. Has trouble sleeping sometimes.   3. BPH: flomax helped. He no longer takes it. No trouble passing urine.   Social History  Substance Use Topics  . Smoking status: Never Smoker   . Smokeless tobacco: Never Used  . Alcohol Use: No    Outpatient Prescriptions Prior to Visit  Medication Sig Dispense Refill  . cyclobenzaprine (FLEXERIL) 5 MG tablet Take 1 tablet (5 mg total) by mouth 3 (three) times daily as needed for muscle spasms. Muscle spasms 90 tablet 2  . DULoxetine (CYMBALTA) 60 MG capsule Take 1 capsule (60 mg total) by mouth daily. 90 capsule 1  . pravastatin (PRAVACHOL) 40 MG tablet Take 1 tablet (40 mg total) by mouth daily. 90 tablet 7  . tamsulosin (FLOMAX) 0.4 MG CAPS capsule Take 1 capsule (0.4 mg total) by mouth daily after supper. 30 capsule 2  . traMADol (ULTRAM) 50 MG tablet Take 1 tablet (50 mg total) by mouth every 12 (twelve) hours as needed for moderate pain (in low back). 60 tablet 3  . traZODone (DESYREL) 100 MG tablet Take 1 tablet (100 mg total) by mouth at bedtime as needed. for sleep 90 tablet 1   No facility-administered medications prior to visit.    ROS Review of Systems  Constitutional: Negative for fever, chills, fatigue and unexpected weight change.  Eyes: Negative for visual disturbance.  Respiratory: Negative for cough and shortness of breath.   Cardiovascular: Negative for chest pain, palpitations and leg swelling.  Gastrointestinal: Negative for nausea, vomiting, abdominal pain, diarrhea, constipation and blood in stool.  Endocrine: Negative for polydipsia, polyphagia and polyuria.    Musculoskeletal: Negative for myalgias, back pain, arthralgias, gait problem and neck pain.  Skin: Negative for rash.  Allergic/Immunologic: Negative for immunocompromised state.  Hematological: Negative for adenopathy. Does not bruise/bleed easily.  Psychiatric/Behavioral: Negative for suicidal ideas, sleep disturbance and dysphoric mood. The patient is not nervous/anxious.     Objective:  BP 100/61 mmHg  Pulse 65  Temp(Src) 98.4 F (36.9 C) (Oral)  Resp 16  Ht  (1.753 m)  Wt 173 lb (78.472 kg)  BMI 25.54 kg/m2  SpO2 98%  BP/Weight 09/23/2015 03/25/2015 12/31/2014  Systolic BP 100 102 114  Diastolic BP 61 62 66  Wt. (Lbs) 173 182 181  BMI 25.54 26.86 26.72    Physical Exam  Constitutional: He appears well-developed and well-nourished. No distress.  HENT:  Head: Normocephalic and atraumatic.  Neck: Normal range of motion. Neck supple.  Cardiovascular: Normal rate, regular rhythm, normal heart sounds and intact distal pulses.   Pulmonary/Chest: Effort normal and breath sounds normal.  Musculoskeletal: He exhibits no edema.  Neurological: He is alert.  Skin: Skin is warm and dry. No rash noted. No erythema.  Psychiatric: He has a normal mood and affect.    Lab Results  Component Value Date   HGBA1C 5.30 09/23/2015   Depression screen [facility] 2/9 09/23/2015 03/25/2015 12/31/2014  Decreased Interest 0 0 0  Down, Depressed, Hopeless 0 0 0  PHQ - 2 Score 0 0 0  Altered  sleeping 1 - -  Tired, decreased energy 1 - -  Change in appetite 1 - -  Feeling bad or failure about yourself  0 - -  Trouble concentrating 0 - -  Moving slowly or fidgety/restless 0 - -  Suicidal thoughts 0 - -  PHQ-9 Score 3 - -    GAD 7 : Generalized Anxiety Score 09/23/2015  Nervous, Anxious, on Edge 0  Control/stop worrying 0  Worry too much - different things 1  Trouble relaxing 1  Restless 0  Easily annoyed or irritable 1  Afraid - awful might happen 0  Total GAD 7 Score 3        Assessment & Plan:   Problem List Items Addressed This Visit    Depression (Chronic)   Relevant Medications   DULoxetine (CYMBALTA) 60 MG capsule   traZODone (DESYREL) 100 MG tablet   DISC DISEASE, LUMBAR (Chronic)   Relevant Medications   cyclobenzaprine (FLEXERIL) 5 MG tablet   RESOLVED: Healthcare maintenance - Primary   Relevant Orders   POCT glycosylated hemoglobin (Hb A1C) (Completed)    Other Visit Diagnoses    Need for hepatitis C screening test        Relevant Orders    Hepatitis C antibody, reflex       No orders of the defined types were placed in this encounter.    Follow-up: No Follow-up on file.   Dessa Phi MD

## 2015-09-23 NOTE — Progress Notes (Signed)
F/U back pain  No pain today  No tobacco user  No suicidal thought in the past two weeks

## 2015-09-23 NOTE — Patient Instructions (Addendum)
Marvin Clark was seen today for back pain.  Diagnoses and all orders for this visit:  Healthcare maintenance -     POCT glycosylated hemoglobin (Hb A1C)  DISC DISEASE, LUMBAR -     cyclobenzaprine (FLEXERIL) 5 MG tablet; Take 1 tablet (5 mg total) by mouth 3 (three) times daily as needed for muscle spasms. Muscle spasms  Depression -     DULoxetine (CYMBALTA) 60 MG capsule; Take 1 capsule (60 mg total) by mouth daily. -     traZODone (DESYREL) 100 MG tablet; Take 1 tablet (100 mg total) by mouth at bedtime as needed. for sleep  Need for hepatitis C screening test -     Hepatitis C antibody, reflex   F/u in 6 months for chronic back pain and depression, call and follow up sooner if needed   Dr. Armen Pickup

## 2015-09-24 LAB — HEPATITIS C ANTIBODY: HCV Ab: NEGATIVE

## 2015-10-13 ENCOUNTER — Telehealth: Payer: Self-pay | Admitting: *Deleted

## 2015-10-13 NOTE — Telephone Encounter (Signed)
LVM to return call.

## 2015-10-13 NOTE — Telephone Encounter (Signed)
-----   Message from Dessa Phi, MD sent at 09/24/2015  8:41 AM EST ----- Screening hep C negative

## 2016-01-04 ENCOUNTER — Other Ambulatory Visit: Payer: Self-pay | Admitting: Family Medicine

## 2016-01-04 DIAGNOSIS — M5137 Other intervertebral disc degeneration, lumbosacral region: Secondary | ICD-10-CM

## 2016-01-04 MED ORDER — TRAMADOL HCL 50 MG PO TABS: 50.0000 mg | ORAL_TABLET | Freq: Two times a day (BID) | ORAL | Status: DC | PRN

## 2016-01-05 NOTE — Telephone Encounter (Signed)
LVM Rx at front office ready to be pick up  Please bring a picture ID

## 2016-01-24 ENCOUNTER — Telehealth: Payer: Self-pay | Admitting: Family Medicine

## 2016-01-24 DIAGNOSIS — F32A Depression, unspecified: Secondary | ICD-10-CM

## 2016-01-24 DIAGNOSIS — F329 Major depressive disorder, single episode, unspecified: Secondary | ICD-10-CM

## 2016-01-24 NOTE — Telephone Encounter (Signed)
Pt. Is requesting a prescription for trazadone..he dropped his medication on the toilet...Marland Kitchen.Marland Kitchen.Marland Kitchen.  Please follow up with patient

## 2016-01-27 MED ORDER — TRAZODONE HCL 100 MG PO TABS: 100.0000 mg | ORAL_TABLET | Freq: Every evening | ORAL | Status: DC | PRN

## 2016-01-27 NOTE — Telephone Encounter (Signed)
Trazodone refilled Patient may get the Rx from the pharmacy is it not a controlled substance. Called patient and left VM requesting a call back.

## 2016-02-03 ENCOUNTER — Ambulatory Visit: Payer: Medicaid Other | Attending: Family Medicine | Admitting: Family Medicine

## 2016-02-03 ENCOUNTER — Encounter: Payer: Self-pay | Admitting: Family Medicine

## 2016-02-03 VITALS — BP 110/66 | HR 74 | Temp 98.6°F | Resp 16 | Ht 69.0 in | Wt 164.0 lb

## 2016-02-03 DIAGNOSIS — F419 Anxiety disorder, unspecified: Secondary | ICD-10-CM | POA: Insufficient documentation

## 2016-02-03 DIAGNOSIS — Z79899 Other long term (current) drug therapy: Secondary | ICD-10-CM | POA: Insufficient documentation

## 2016-02-03 DIAGNOSIS — F32A Depression, unspecified: Secondary | ICD-10-CM

## 2016-02-03 DIAGNOSIS — F329 Major depressive disorder, single episode, unspecified: Secondary | ICD-10-CM | POA: Insufficient documentation

## 2016-02-03 DIAGNOSIS — J302 Other seasonal allergic rhinitis: Secondary | ICD-10-CM | POA: Diagnosis not present

## 2016-02-03 DIAGNOSIS — E7800 Pure hypercholesterolemia, unspecified: Secondary | ICD-10-CM

## 2016-02-03 DIAGNOSIS — M5137 Other intervertebral disc degeneration, lumbosacral region: Secondary | ICD-10-CM | POA: Diagnosis not present

## 2016-02-03 MED ORDER — TRAZODONE HCL 150 MG PO TABS: 150.0000 mg | ORAL_TABLET | Freq: Every evening | ORAL | Status: DC | PRN

## 2016-02-03 MED ORDER — CYCLOBENZAPRINE HCL 5 MG PO TABS: 5.0000 mg | ORAL_TABLET | Freq: Three times a day (TID) | ORAL | Status: DC | PRN

## 2016-02-03 MED ORDER — KETOTIFEN FUMARATE 0.025 % OP SOLN: 1.0000 [drp] | Freq: Two times a day (BID) | OPHTHALMIC | Status: DC

## 2016-02-03 MED ORDER — DULOXETINE HCL 60 MG PO CPEP: 60.0000 mg | ORAL_CAPSULE | Freq: Every day | ORAL | Status: DC

## 2016-02-03 MED ORDER — PRAVASTATIN SODIUM 40 MG PO TABS: 40.0000 mg | ORAL_TABLET | Freq: Every day | ORAL | Status: DC

## 2016-02-03 NOTE — Patient Instructions (Addendum)
Janyth Pupaicholas was seen today for anxiety.  Diagnoses and all orders for this visit:  Depression -     Discontinue: traZODone (DESYREL) 150 MG tablet; Take 1 tablet (150 mg total) by mouth at bedtime as needed. for sleep -     traZODone (DESYREL) 150 MG tablet; Take 1 tablet (150 mg total) by mouth at bedtime as needed. for sleep -     DULoxetine (CYMBALTA) 60 MG capsule; Take 1 capsule (60 mg total) by mouth daily.  Seasonal allergies -     Discontinue: ketotifen (ZADITOR) 0.025 % ophthalmic solution; Place 1 drop into both eyes 2 (two) times daily. -     ketotifen (ZADITOR) 0.025 % ophthalmic solution; Place 1 drop into both eyes 2 (two) times daily.  DISC DISEASE, LUMBAR -     Discontinue: cyclobenzaprine (FLEXERIL) 5 MG tablet; Take 1 tablet (5 mg total) by mouth 3 (three) times daily as needed for muscle spasms. Muscle spasms -     cyclobenzaprine (FLEXERIL) 5 MG tablet; Take 1 tablet (5 mg total) by mouth 3 (three) times daily as needed for muscle spasms. Muscle spasms  High cholesterol -     pravastatin (PRAVACHOL) 40 MG tablet; Take 1 tablet (40 mg total) by mouth daily.

## 2016-02-03 NOTE — Progress Notes (Signed)
Subjective:  Patient ID: Marvin Clark, male    DOB: 03-02-1956  Age: 60 y.o. MRN: [id number]  CC: Anxiety   HPI Marvin Clark presents for   1. Anxiety: waxes and wanes. Worsened by unemployed. He has worked for 4-6 weeks off and on this year so far. He has started working today. He anxiety is less when he works. He often has trouble sleeping. He has increase trazodone from 100 to 150 mg QHS with improvement.  He has decreased appetite and has been losing weight. He denies SI. He lives alone.   2. Allergies: puffiness and itching under eyes. Cough and sneezing. Symptoms are intermittent.  No fever or chills. No CP or SOB.   He request med refills  Social History  Substance Use Topics  . Smoking status: Never Smoker   . Smokeless tobacco: Never Used  . Alcohol Use: No    Outpatient Prescriptions Prior to Visit  Medication Sig Dispense Refill  . cyclobenzaprine (FLEXERIL) 5 MG tablet Take 1 tablet (5 mg total) by mouth 3 (three) times daily as needed for muscle spasms. Muscle spasms 90 tablet 5  . DULoxetine (CYMBALTA) 60 MG capsule Take 1 capsule (60 mg total) by mouth daily. 90 capsule 2  . pravastatin (PRAVACHOL) 40 MG tablet Take 1 tablet (40 mg total) by mouth daily. 90 tablet 7  . traMADol (ULTRAM) 50 MG tablet Take 1 tablet (50 mg total) by mouth every 12 (twelve) hours as needed for moderate pain (in low back). 60 tablet 2  . traZODone (DESYREL) 100 MG tablet Take 1 tablet (100 mg total) by mouth at bedtime as needed. for sleep 90 tablet 3   No facility-administered medications prior to visit.    ROS Review of Systems  Constitutional: Positive for appetite change. Negative for fever, chills, fatigue and unexpected weight change.  HENT: Positive for sneezing.   Eyes: Positive for discharge and itching. Negative for visual disturbance.  Respiratory: Negative for cough and shortness of breath.   Cardiovascular: Negative for chest pain, palpitations and leg  swelling.  Gastrointestinal: Negative for nausea, vomiting, abdominal pain, diarrhea, constipation and blood in stool.  Endocrine: Negative for polydipsia, polyphagia and polyuria.  Musculoskeletal: Negative for myalgias, back pain, arthralgias, gait problem and neck pain.  Skin: Negative for rash.  Allergic/Immunologic: Negative for immunocompromised state.  Hematological: Negative for adenopathy. Does not bruise/bleed easily.  Psychiatric/Behavioral: Positive for sleep disturbance. Negative for suicidal ideas and dysphoric mood. The patient is nervous/anxious.     Objective:  BP 110/66 mmHg  Pulse 74  Temp(Src) 98.6 F (37 C) (Oral)  Resp 16  Ht 5\' 9"  (1.753 m)  Wt 164 lb (74.39 kg)  BMI 24.21 kg/m2  SpO2 96%  BP/Weight 02/03/2016 09/23/2015 03/25/2015  Systolic BP 110 100 102  Diastolic BP 66 61 62  Wt. (Lbs) 164 173 182  BMI 24.21 25.54 26.86    Physical Exam  Constitutional: He appears well-developed and well-nourished. No distress.  HENT:  Head: Normocephalic and atraumatic.  Nose: Mucosal edema present.  Eyes:    Neck: Normal range of motion. Neck supple.  Cardiovascular: Normal rate, regular rhythm, normal heart sounds and intact distal pulses.   Pulmonary/Chest: Effort normal and breath sounds normal.  Musculoskeletal: He exhibits no edema.  Neurological: He is alert.  Skin: Skin is warm and dry. No rash noted. No erythema.  Psychiatric: He has a normal mood and affect.   Depression screen [facility] 2/9 02/03/2016 09/23/2015 03/25/2015 12/31/2014  Decreased Interest 1 0 0 0  Down, Depressed, Hopeless 0 0 0 0  PHQ - 2 Score 1 0 0 0  Altered sleeping 1 1 - -  Tired, decreased energy 1 1 - -  Change in appetite 2 1 - -  Feeling bad or failure about yourself  1 0 - -  Trouble concentrating 0 0 - -  Moving slowly or fidgety/restless 0 0 - -  Suicidal thoughts 0 0 - -  PHQ-9 Score 6 3 - -    GAD 7 : Generalized Anxiety Score 02/03/2016 09/23/2015  Nervous, Anxious, on Edge  1 0  Control/stop worrying 1 0  Worry too much - different things 1 1  Trouble relaxing 1 1  Restless 0 0  Easily annoyed or irritable 0 1  Afraid - awful might happen 0 0  Total GAD 7 Score 4 3      Assessment & Plan:   There are no diagnoses linked to this encounter. Terin was seen today for anxiety.  Diagnoses and all orders for this visit:  Depression -     Discontinue: traZODone (DESYREL) 150 MG tablet; Take 1 tablet (150 mg total) by mouth at bedtime as needed. for sleep -     traZODone (DESYREL) 150 MG tablet; Take 1 tablet (150 mg total) by mouth at bedtime as needed. for sleep -     DULoxetine (CYMBALTA) 60 MG capsule; Take 1 capsule (60 mg total) by mouth daily.  Seasonal allergies -     Discontinue: ketotifen (ZADITOR) 0.025 % ophthalmic solution; Place 1 drop into both eyes 2 (two) times daily. -     ketotifen (ZADITOR) 0.025 % ophthalmic solution; Place 1 drop into both eyes 2 (two) times daily.  DISC DISEASE, LUMBAR -     Discontinue: cyclobenzaprine (FLEXERIL) 5 MG tablet; Take 1 tablet (5 mg total) by mouth 3 (three) times daily as needed for muscle spasms. Muscle spasms -     cyclobenzaprine (FLEXERIL) 5 MG tablet; Take 1 tablet (5 mg total) by mouth 3 (three) times daily as needed for muscle spasms. Muscle spasms  High cholesterol -     pravastatin (PRAVACHOL) 40 MG tablet; Take 1 tablet (40 mg total) by mouth daily.   Meds ordered this encounter  Medications  . DISCONTD: traZODone (DESYREL) 150 MG tablet    Sig: Take 1 tablet (150 mg total) by mouth at bedtime as needed. for sleep    Dispense:  30 tablet    Refill:  5  . DISCONTD: ketotifen (ZADITOR) 0.025 % ophthalmic solution    Sig: Place 1 drop into both eyes 2 (two) times daily.    Dispense:  10 mL    Refill:  1  . DISCONTD: cyclobenzaprine (FLEXERIL) 5 MG tablet    Sig: Take 1 tablet (5 mg total) by mouth 3 (three) times daily as needed for muscle spasms. Muscle spasms    Dispense:  90  tablet    Refill:  5  . traZODone (DESYREL) 150 MG tablet    Sig: Take 1 tablet (150 mg total) by mouth at bedtime as needed. for sleep    Dispense:  30 tablet    Refill:  5  . cyclobenzaprine (FLEXERIL) 5 MG tablet    Sig: Take 1 tablet (5 mg total) by mouth 3 (three) times daily as needed for muscle spasms. Muscle spasms    Dispense:  90 tablet    Refill:  5  . DULoxetine (CYMBALTA) 60 MG  capsule    Sig: Take 1 capsule (60 mg total) by mouth daily.    Dispense:  90 capsule    Refill:  2  . ketotifen (ZADITOR) 0.025 % ophthalmic solution    Sig: Place 1 drop into both eyes 2 (two) times daily.    Dispense:  10 mL    Refill:  1  . pravastatin (PRAVACHOL) 40 MG tablet    Sig: Take 1 tablet (40 mg total) by mouth daily.    Dispense:  90 tablet    Refill:  3    Follow-up: No Follow-up on file.   Dessa PhiJosalyn Storey Stangeland MD

## 2016-02-03 NOTE — Progress Notes (Signed)
F/U anxiety  Unable to sleep  No pain today  No suicidal thoughts in the past two weeks

## 2016-02-04 NOTE — Assessment & Plan Note (Signed)
Persistent mild to moderate anxiety and depression. Anticipate improvement with work.   continue Cymbalta 60 mg daily Refilled trazodone at increase dose of 150 mg daily

## 2016-03-10 ENCOUNTER — Encounter (HOSPITAL_COMMUNITY): Payer: Self-pay | Admitting: Emergency Medicine

## 2016-03-10 ENCOUNTER — Emergency Department (HOSPITAL_COMMUNITY)
Admission: EM | Admit: 2016-03-10 | Discharge: 2016-03-11 | Disposition: A | Discharge status: Home / Self Care | Payer: Medicaid Other | Attending: Emergency Medicine | Admitting: Emergency Medicine

## 2016-03-10 ENCOUNTER — Emergency Department (HOSPITAL_COMMUNITY): Payer: Medicaid Other

## 2016-03-10 DIAGNOSIS — R0789 Other chest pain: Secondary | ICD-10-CM | POA: Diagnosis present

## 2016-03-10 DIAGNOSIS — R079 Chest pain, unspecified: Secondary | ICD-10-CM

## 2016-03-10 LAB — CBC
HEMATOCRIT: 45.1 % (ref 39.0–52.0)
Hemoglobin: 15.2 g/dL (ref 13.0–17.0)
MCH: 29.9 pg (ref 26.0–34.0)
MCHC: 33.7 g/dL (ref 30.0–36.0)
MCV: 88.6 fL (ref 78.0–100.0)
Platelets: 225 10*3/uL (ref 150–400)
RBC: 5.09 MIL/uL (ref 4.22–5.81)
RDW: 12.5 % (ref 11.5–15.5)
WBC: 12.4 10*3/uL — ABNORMAL HIGH (ref 4.0–10.5)

## 2016-03-10 LAB — BASIC METABOLIC PANEL
ANION GAP: 5 (ref 5–15)
BUN: 8 mg/dL (ref 6–20)
CALCIUM: 9 mg/dL (ref 8.9–10.3)
CHLORIDE: 105 mmol/L (ref 101–111)
CO2: 30 mmol/L (ref 22–32)
Creatinine, Ser: 0.86 mg/dL (ref 0.61–1.24)
GFR calc non Af Amer: >60 mL/min (ref >60)
GLUCOSE: 83 mg/dL (ref 65–99)
POTASSIUM: 3.4 mmol/L — AB (ref 3.5–5.1)
Sodium: 140 mmol/L (ref 135–145)

## 2016-03-10 LAB — I-STAT TROPONIN, ED
TROPONIN I, POC: 0 ng/mL (ref 0.00–0.08)
Troponin i, poc: 0 ng/mL (ref 0.00–0.08)

## 2016-03-10 LAB — D-DIMER, QUANTITATIVE: D-Dimer, Quant: <0.27 ug/mL-FEU (ref 0.00–0.50)

## 2016-03-10 MED ORDER — MORPHINE SULFATE (PF) 4 MG/ML IV SOLN
4.0000 mg | Freq: Once | INTRAVENOUS | Status: AC
  Administered 2016-03-10: 4 mg via INTRAVENOUS
  Filled 2016-03-10: qty 1

## 2016-03-10 MED ORDER — METHOCARBAMOL 500 MG PO TABS: 500.0000 mg | ORAL_TABLET | Freq: Two times a day (BID) | ORAL | Status: DC

## 2016-03-10 MED ORDER — OXYCODONE-ACETAMINOPHEN 5-325 MG PO TABS: 1.0000 | ORAL_TABLET | Freq: Four times a day (QID) | ORAL | Status: DC | PRN

## 2016-03-10 MED ORDER — NAPROXEN 500 MG PO TABS: 500.0000 mg | ORAL_TABLET | Freq: Two times a day (BID) | ORAL | Status: DC

## 2016-03-10 MED ORDER — ONDANSETRON HCL 4 MG/2ML IJ SOLN
4.0000 mg | Freq: Once | INTRAMUSCULAR | Status: AC
  Administered 2016-03-10: 4 mg via INTRAVENOUS
  Filled 2016-03-10: qty 2

## 2016-03-10 MED ORDER — METHOCARBAMOL 500 MG PO TABS
1000.0000 mg | ORAL_TABLET | Freq: Once | ORAL | Status: AC
  Administered 2016-03-10: 1000 mg via ORAL
  Filled 2016-03-10: qty 2

## 2016-03-10 MED ORDER — KETOROLAC TROMETHAMINE 30 MG/ML IJ SOLN
30.0000 mg | Freq: Once | INTRAMUSCULAR | Status: AC
  Administered 2016-03-10: 30 mg via INTRAVENOUS
  Filled 2016-03-10: qty 1

## 2016-03-10 NOTE — ED Provider Notes (Signed)
CSN: [id number]     Arrival date & time 03/10/16  0321 History   First MD Initiated Contact with Patient 03/10/16 [phone]     Chief Complaint  Patient presents with  . Back Pain  . Chest Pain     (Consider location/radiation/quality/duration/timing/severity/associated sxs/prior Treatment) HPI   Marvin Clark is a 60 y.o. male, with a history of chronic lower back pain, presenting to the ED with constant chest pain that began around 1 AM this morning. States he went to bed and was woken from a sleep by the pain in his chest. Pain worse with inspiration. Rates pain at 7/10, describes it as a pressure, nonradiating. Has not felt this before and has not taken anything for the pain. Pt has not done any new physical activity lately. Does lift up to 40 lbs at work, but did not work yesterday or this evening. Pt denies recent illness, cough, fever/chills, N/V, shortness of breath, trauma, or any other complaints.      Past Medical History  Diagnosis Date  . Diverticulosis of colon   . Depression 2013   . Hyperlipidemia 2013   . Low back pain 1997     had pain x 6 weeks, then had surgery, did well for 6 years. had L sided sciatica   Past Surgical History  Procedure Laterality Date  . Lumbar disc surgery  1997    Family History  Problem Relation Age of Onset  . COPD Mother   . Cancer Father    Social History  Substance Use Topics  . Smoking status: Never Smoker   . Smokeless tobacco: Never Used  . Alcohol Use: No    Review of Systems  Constitutional: Negative for fever, chills and diaphoresis.  Respiratory: Negative for cough and shortness of breath.   Cardiovascular: Positive for chest pain.  Gastrointestinal: Negative for nausea, vomiting and abdominal pain.  Neurological: Negative for dizziness, light-headedness and headaches.  All other systems reviewed and are negative.     Allergies  Review of patient's allergies indicates no known allergies.  Home Medications    Prior to Admission medications   Medication Sig Start Date End Date Taking? Authorizing Provider  cyclobenzaprine (FLEXERIL) 5 MG tablet Take 1 tablet (5 mg total) by mouth 3 (three) times daily as needed for muscle spasms. Muscle spasms 02/03/16   Dessa Phi, MD  DULoxetine (CYMBALTA) 60 MG capsule Take 1 capsule (60 mg total) by mouth daily. 02/03/16   Josalyn Funches, MD  ketotifen (ZADITOR) 0.025 % ophthalmic solution Place 1 drop into both eyes 2 (two) times daily. 02/03/16   Josalyn Funches, MD  pravastatin (PRAVACHOL) 40 MG tablet Take 1 tablet (40 mg total) by mouth daily. 02/03/16   Josalyn Funches, MD  traMADol (ULTRAM) 50 MG tablet Take 1 tablet (50 mg total) by mouth every 12 (twelve) hours as needed for moderate pain (in low back). 01/04/16   Dessa Phi, MD  traZODone (DESYREL) 150 MG tablet Take 1 tablet (150 mg total) by mouth at bedtime as needed. for sleep 02/03/16   Josalyn Funches, MD   BP 129/80 mmHg  Pulse 85  Temp(Src) 97.9 F (36.6 C) (Oral)  Resp 20  Ht  (1.753 m)  Wt 75.751 kg  BMI 24.65 kg/m2  SpO2 98% Physical Exam  Constitutional: He appears well-developed and well-nourished. No distress.  HENT:  Head: Normocephalic and atraumatic.  Eyes: Conjunctivae are normal.  Neck: Neck supple.  Cardiovascular: Normal rate, regular rhythm, normal heart sounds  and intact distal pulses.   Pulmonary/Chest: Effort normal and breath sounds normal. No respiratory distress.  Some mild, diffuse chest tenderness but no crepitus, instability, deformities, or any other abnormalities.  Abdominal: Soft. There is no tenderness. There is no guarding.  Musculoskeletal: He exhibits no edema or tenderness.  Lymphadenopathy:    He has no cervical adenopathy.  Neurological: He is alert.  Skin: Skin is warm and dry. He is not diaphoretic.  Psychiatric: He has a normal mood and affect. His behavior is normal.  Nursing note and vitals reviewed.   ED Course  Procedures (including  critical care time) Labs Review Labs Reviewed  BASIC METABOLIC PANEL - Abnormal; Notable for the following:    Potassium 3.4 (*)    All other components within normal limits  CBC - Abnormal; Notable for the following:    WBC 12.4 (*)    All other components within normal limits  D-DIMER, QUANTITATIVE (NOT AT [facility])  I-STAT TROPOININ, ED  Rosezena SensorI-STAT TROPOININ, ED    Imaging Review Dg Chest 2 View  03/10/2016  CLINICAL DATA:  Acute onset of shortness of breath and generalized chest pain. Initial encounter. EXAM: CHEST  2 VIEW COMPARISON:  None. FINDINGS: The lungs are well-aerated. Mild left basilar atelectasis is noted. There is no evidence of pleural effusion or pneumothorax. The heart is borderline normal in size. No acute osseous abnormalities are seen. IMPRESSION: Mild left basilar atelectasis noted.  Lungs otherwise grossly clear. Electronically Signed   By: Roanna RaiderJeffery  Chang M.D.   On: 03/10/2016 04:08   I have personally reviewed and evaluated these images and lab results as part of my medical decision-making.   EKG Interpretation   Date/Time:  Friday March 10 2016 03:30:52 EDT Ventricular Rate:  83 PR Interval:    QRS Duration: 95 QT Interval:  364 QTC Calculation: 428 R Axis:   73 Text Interpretation:  Sinus rhythm Probable anteroseptal infarct, old No  prior for comparison Confirmed by HORTON  MD, Toni AmendOURTNEY ([id number]) on 03/10/2016  3:41:47 AM Also confirmed by Wilkie AyeHORTON  MD, Toni AmendOURTNEY ([id number])  on 03/10/2016  5:29:50 AM      MDM   Final diagnoses:  Chest pain, unspecified chest pain type    Marvin Clark presents with chest pain that came on suddenly around 1 AM this morning.  Findings and plan of care discussed with Shon Batonourtney F Horton, MD.  HEART score is 3, indicating low risk for a cardiac event. Wells criteria score is 0, indicating low risk for PE. Patient is nontoxic appearing, afebrile, not tachycardic, not tachypneic, not hypotensive, and maintains SPO2 of 97% on room  air. Patient has no signs of sepsis or other serious or life-threatening condition. Initial EKG and lab findings do not suggest ACS. D-dimer due to the pleuritic nature of the patient's pain. Low suspicion of thoracic aortic dissection. Patient improved somewhat with morphine, but patient improved dramatically with Toradol and Robaxin. Upon reassessment, patient was noted to be sleeping comfortably. Delta troponins are negative. D-dimer negative. The patient was given instructions for home care as well as return precautions. Patient voices understanding of these instructions, accepts the plan, and is comfortable with discharge.     Filed Vitals:   03/10/16 0324 03/10/16 0329 03/10/16 0330 03/10/16 0345  BP:  129/80 124/76 118/78  Pulse:  85 84 89  Temp:  97.9 F (36.6 C)    TempSrc:  Oral    Resp:  20 16 24   Height:  5\' 9"  (1.753 m)  Weight:  75.751 kg    SpO2: 96% 98% 97% 97%   Filed Vitals:   03/10/16 0515 03/10/16 0530 03/10/16 0545 03/10/16 0600  BP: 125/76 129/87 127/77 118/69  Pulse: 93 93 95 93  Temp:      TempSrc:      Resp: 24 17 17 26   Height:      Weight:      SpO2: 96% 95% 97% 96%     Anselm PancoastShawn C Ivory Maduro, PA-C 03/10/16 0630  Shon Batonourtney F Horton, MD 03/11/16 [phone]

## 2016-03-10 NOTE — Discharge Instructions (Signed)
You have been seen today for chest pain. Your imaging and lab tests showed no abnormalities. Expect your soreness to increase over the next 2-3 days. Take it easy, but do not lay around too much as this may make the stiffness worse. Take 500 mg of naproxen every 12 hours or 800 mg of ibuprofen every 8 hours for the next 3 days. Take these medications with food to avoid upset stomach. Robaxin is a muscle relaxer and may help loosen stiff muscles. Percocet for severe pain. Do not take the Robaxin or Percocet while driving or performing other dangerous activities. Follow up with PCP as needed. Return to ED should symptoms worsen.

## 2016-03-10 NOTE — ED Notes (Signed)
Pt arrives via EMS with c/o chest pain/back pain/bilateral pain starting today. Worse with palpation, inspiration and movement. Pt reports working in [employer] and does a lot of heavy lifting - thinks he might have pulled something. Hx anxiety. EMS initiated IV access, no meds given.

## 2016-03-14 ENCOUNTER — Telehealth (HOSPITAL_BASED_OUTPATIENT_CLINIC_OR_DEPARTMENT_OTHER): Payer: Self-pay | Admitting: Emergency Medicine

## 2016-03-14 ENCOUNTER — Encounter (HOSPITAL_BASED_OUTPATIENT_CLINIC_OR_DEPARTMENT_OTHER): Payer: Self-pay | Admitting: Emergency Medicine

## 2016-04-14 ENCOUNTER — Telehealth: Payer: Self-pay | Admitting: Family Medicine

## 2016-04-14 DIAGNOSIS — M5137 Other intervertebral disc degeneration, lumbosacral region: Secondary | ICD-10-CM

## 2016-04-14 NOTE — Telephone Encounter (Signed)
Pt. Called requesting a refill on Tramadol. Please f/u °

## 2016-04-15 ENCOUNTER — Other Ambulatory Visit: Payer: Self-pay | Admitting: Family Medicine

## 2016-04-15 DIAGNOSIS — M5137 Other intervertebral disc degeneration, lumbosacral region: Secondary | ICD-10-CM

## 2016-04-17 MED ORDER — TRAMADOL HCL 50 MG PO TABS: 50.0000 mg | ORAL_TABLET | Freq: Two times a day (BID) | ORAL | 0 refills | Status: DC | PRN

## 2016-04-17 NOTE — Telephone Encounter (Signed)
Patient was informed of prescription being ready.

## 2016-04-17 NOTE — Telephone Encounter (Signed)
Tramadol refilled Patient due for hx 3 month f/u for depression and back pain and flu shot next month

## 2016-05-19 ENCOUNTER — Other Ambulatory Visit: Payer: Self-pay | Admitting: Family Medicine

## 2016-05-19 DIAGNOSIS — M5137 Other intervertebral disc degeneration, lumbosacral region: Secondary | ICD-10-CM

## 2016-05-19 NOTE — Telephone Encounter (Signed)
Tramadol ready for pick up  

## 2016-05-22 NOTE — Telephone Encounter (Signed)
Pt was called and informed of script being ready for pick up.

## 2016-05-25 ENCOUNTER — Ambulatory Visit: Payer: Medicaid Other | Admitting: Family Medicine

## 2016-06-23 ENCOUNTER — Other Ambulatory Visit: Payer: Self-pay | Admitting: Family Medicine

## 2016-06-23 ENCOUNTER — Encounter: Payer: Self-pay | Admitting: Family Medicine

## 2016-06-23 ENCOUNTER — Ambulatory Visit: Payer: Medicaid Other | Attending: Family Medicine | Admitting: Family Medicine

## 2016-06-23 VITALS — BP 100/64 | HR 73 | Temp 98.4°F | Ht 69.0 in | Wt 173.6 lb

## 2016-06-23 DIAGNOSIS — F321 Major depressive disorder, single episode, moderate: Secondary | ICD-10-CM

## 2016-06-23 DIAGNOSIS — F419 Anxiety disorder, unspecified: Secondary | ICD-10-CM | POA: Diagnosis not present

## 2016-06-23 DIAGNOSIS — Z79899 Other long term (current) drug therapy: Secondary | ICD-10-CM | POA: Insufficient documentation

## 2016-06-23 DIAGNOSIS — Z0001 Encounter for general adult medical examination with abnormal findings: Secondary | ICD-10-CM | POA: Insufficient documentation

## 2016-06-23 MED ORDER — BUSPIRONE HCL 7.5 MG PO TABS: 7.5000 mg | ORAL_TABLET | Freq: Two times a day (BID) | ORAL | 1 refills | Status: DC

## 2016-06-23 NOTE — Assessment & Plan Note (Signed)
Persistent mild to moderate anxiety and depression. Anticipate improvement with work.   continue Cymbalta 60 mg daily continue trazodone at increase dose of 150 mg daily  Add buspar 7.5 mg BID

## 2016-06-23 NOTE — Progress Notes (Signed)
Subjective:  Patient ID: Marvin Clark, male    DOB: 06-18-1956  Age: 60 y.o. MRN: [id number]  CC: Follow-up (anxiety)   HPI Marvin Clark presents for   1. Anxiety: waxes and wanes. Worsened by unemployed. He has worked for 4-6 weeks off and on this year so far. He is most stressed by unemployment.Marland Kitchen. He anxiety is less when he works. He often has trouble sleeping. He has decreased appetite and has been losing weight. He denies SI. He lives alone.  He is compliant with trazodone and cymbalta.   Social History  Substance Use Topics  . Smoking status: Never Smoker  . Smokeless tobacco: Never Used  . Alcohol use No    Outpatient Medications Prior to Visit  Medication Sig Dispense Refill  . cyclobenzaprine (FLEXERIL) 5 MG tablet Take 1 tablet (5 mg total) by mouth 3 (three) times daily as needed for muscle spasms. Muscle spasms 90 tablet 5  . DULoxetine (CYMBALTA) 60 MG capsule Take 1 capsule (60 mg total) by mouth daily. 90 capsule 2  . methocarbamol (ROBAXIN) 500 MG tablet Take 1 tablet (500 mg total) by mouth 2 (two) times daily. 20 tablet 0  . pravastatin (PRAVACHOL) 40 MG tablet Take 1 tablet (40 mg total) by mouth daily. 90 tablet 3  . traMADol (ULTRAM) 50 MG tablet TAKE 1 TABLET BY MOUTH EVERY 12 HOURS AS NEEDED FOR MODERATE PAIN IN LOW BACK 60 tablet 0  . traZODone (DESYREL) 150 MG tablet Take 1 tablet (150 mg total) by mouth at bedtime as needed. for sleep 30 tablet 5  . ketotifen (ZADITOR) 0.025 % ophthalmic solution Place 1 drop into both eyes 2 (two) times daily. (Patient not taking: Reported on 06/23/2016) 10 mL 1  . naproxen (NAPROSYN) 500 MG tablet Take 1 tablet (500 mg total) by mouth 2 (two) times daily. (Patient not taking: Reported on 06/23/2016) 30 tablet 0   No facility-administered medications prior to visit.     ROS Review of Systems  Constitutional: Positive for appetite change. Negative for chills, fatigue, fever and unexpected weight change.  HENT:  Positive for sneezing.   Eyes: Positive for discharge and itching. Negative for visual disturbance.  Respiratory: Negative for cough and shortness of breath.   Cardiovascular: Negative for chest pain, palpitations and leg swelling.  Gastrointestinal: Negative for abdominal pain, blood in stool, constipation, diarrhea, nausea and vomiting.  Endocrine: Negative for polydipsia, polyphagia and polyuria.  Musculoskeletal: Negative for arthralgias, back pain, gait problem, myalgias and neck pain.  Skin: Negative for rash.  Allergic/Immunologic: Negative for immunocompromised state.  Hematological: Negative for adenopathy. Does not bruise/bleed easily.  Psychiatric/Behavioral: Positive for sleep disturbance. Negative for dysphoric mood and suicidal ideas. The patient is nervous/anxious.     Objective:  BP 100/64 (BP Location: Left Arm, Patient Position: Sitting, Cuff Size: Small)   Pulse 73   Temp 98.4 F (36.9 C) (Oral)   Ht 5\' 9"  (1.753 m)   Wt 173 lb 9.6 oz (78.7 kg)   SpO2 96%   BMI 25.64 kg/m   BP/Weight 06/23/2016 03/10/2016 02/03/2016  Systolic BP 100 113 110  Diastolic BP 64 69 66  Wt. (Lbs) 173.6 167 164  BMI 25.64 24.65 24.21    Physical Exam  Constitutional: He appears well-developed and well-nourished. No distress.  HENT:  Head: Normocephalic and atraumatic.  Nose: No mucosal edema.  Eyes:    Neck: Normal range of motion. Neck supple.  Cardiovascular: Normal rate, regular rhythm, normal heart sounds  and intact distal pulses.   Pulmonary/Chest: Effort normal and breath sounds normal.  Musculoskeletal: He exhibits no edema.  Neurological: He is alert.  Skin: Skin is warm and dry. No rash noted. No erythema.  Psychiatric: He has a normal mood and affect.   Depression screen [facility] 2/9 06/23/2016 02/03/2016 09/23/2015 03/25/2015 12/31/2014  Decreased Interest 2 1 0 0 0  Down, Depressed, Hopeless 2 0 0 0 0  PHQ - 2 Score 4 1 0 0 0  Altered sleeping 1 1 1  - -  Tired, decreased  energy 2 1 1  - -  Change in appetite 0 2 1 - -  Feeling bad or failure about yourself  1 1 0 - -  Trouble concentrating 1 0 0 - -  Moving slowly or fidgety/restless 0 0 0 - -  Suicidal thoughts 0 0 0 - -  PHQ-9 Score 9 6 3  - -    GAD 7 : Generalized Anxiety Score 06/23/2016 02/03/2016 09/23/2015  Nervous, Anxious, on Edge 0 1 0  Control/stop worrying 1 1 0  Worry too much - different things 1 1 1   Trouble relaxing 1 1 1   Restless 0 0 0  Easily annoyed or irritable 1 0 1  Afraid - awful might happen 0 0 0  Total GAD 7 Score 4 4 3       Assessment & Plan:   There are no diagnoses linked to this encounter. There are no diagnoses linked to this encounter. No orders of the defined types were placed in this encounter.   Follow-up: No Follow-up on file.   Dessa Phi MD

## 2016-06-23 NOTE — Progress Notes (Signed)
Pt declined flu shot °

## 2016-06-23 NOTE — Patient Instructions (Addendum)
Marvin Clark was seen today for follow-up.  Diagnoses and all orders for this visit:  Moderate single current episode of major depressive disorder (HCC) -     Ambulatory referral to Psychology -     busPIRone (BUSPAR) 7.5 MG tablet; Take 1 tablet (7.5 mg total) by mouth 2 (two) times daily.   F/u in 4 weeks for depression and anxiety   Dr. Armen PickupFunches

## 2016-06-26 ENCOUNTER — Other Ambulatory Visit: Payer: Self-pay | Admitting: Family Medicine

## 2016-06-26 DIAGNOSIS — M5137 Other intervertebral disc degeneration, lumbosacral region: Secondary | ICD-10-CM

## 2016-06-30 NOTE — Telephone Encounter (Signed)
Pt was called and informed of script being ready for pick up.

## 2016-06-30 NOTE — Telephone Encounter (Signed)
Tramadol ready for pick up  

## 2016-09-05 ENCOUNTER — Ambulatory Visit: Payer: Medicaid Other | Admitting: Family Medicine

## 2016-09-05 ENCOUNTER — Other Ambulatory Visit: Payer: Self-pay | Admitting: Family Medicine

## 2016-09-05 DIAGNOSIS — F329 Major depressive disorder, single episode, unspecified: Secondary | ICD-10-CM

## 2016-09-05 DIAGNOSIS — F321 Major depressive disorder, single episode, moderate: Secondary | ICD-10-CM

## 2016-09-05 DIAGNOSIS — M5137 Other intervertebral disc degeneration, lumbosacral region: Secondary | ICD-10-CM

## 2016-09-05 DIAGNOSIS — F32A Depression, unspecified: Secondary | ICD-10-CM

## 2016-09-07 ENCOUNTER — Encounter (HOSPITAL_COMMUNITY): Payer: Self-pay | Admitting: *Deleted

## 2016-09-07 ENCOUNTER — Inpatient Hospital Stay (HOSPITAL_COMMUNITY)
Admission: RE | Admit: 2016-09-07 | Discharge: 2016-09-12 | DRG: 897 | Disposition: A | Discharge status: Home / Self Care | Payer: Medicaid Other | Attending: Psychiatry | Admitting: Psychiatry

## 2016-09-07 DIAGNOSIS — Z79899 Other long term (current) drug therapy: Secondary | ICD-10-CM | POA: Diagnosis not present

## 2016-09-07 DIAGNOSIS — Z825 Family history of asthma and other chronic lower respiratory diseases: Secondary | ICD-10-CM

## 2016-09-07 DIAGNOSIS — Z809 Family history of malignant neoplasm, unspecified: Secondary | ICD-10-CM

## 2016-09-07 DIAGNOSIS — F1124 Opioid dependence with opioid-induced mood disorder: Principal | ICD-10-CM | POA: Diagnosis present

## 2016-09-07 DIAGNOSIS — Z811 Family history of alcohol abuse and dependence: Secondary | ICD-10-CM

## 2016-09-07 DIAGNOSIS — R45851 Suicidal ideations: Secondary | ICD-10-CM | POA: Diagnosis present

## 2016-09-07 DIAGNOSIS — Z9889 Other specified postprocedural states: Secondary | ICD-10-CM

## 2016-09-07 DIAGNOSIS — G4700 Insomnia, unspecified: Secondary | ICD-10-CM | POA: Diagnosis present

## 2016-09-07 DIAGNOSIS — Z808 Family history of malignant neoplasm of other organs or systems: Secondary | ICD-10-CM | POA: Diagnosis not present

## 2016-09-07 DIAGNOSIS — F332 Major depressive disorder, recurrent severe without psychotic features: Secondary | ICD-10-CM | POA: Diagnosis present

## 2016-09-07 DIAGNOSIS — Z8489 Family history of other specified conditions: Secondary | ICD-10-CM | POA: Diagnosis not present

## 2016-09-07 DIAGNOSIS — E7800 Pure hypercholesterolemia, unspecified: Secondary | ICD-10-CM

## 2016-09-07 DIAGNOSIS — F329 Major depressive disorder, single episode, unspecified: Secondary | ICD-10-CM | POA: Diagnosis present

## 2016-09-07 MED ORDER — ALUM & MAG HYDROXIDE-SIMETH 200-200-20 MG/5ML PO SUSP: 30.0000 mL | ORAL | Status: DC | PRN

## 2016-09-07 MED ORDER — DULOXETINE HCL 60 MG PO CPEP
60.0000 mg | ORAL_CAPSULE | Freq: Every day | ORAL | Status: DC
  Administered 2016-09-08 – 2016-09-12 (×5): 60 mg via ORAL
  Filled 2016-09-07 (×7): qty 1

## 2016-09-07 MED ORDER — BUSPIRONE HCL 15 MG PO TABS
7.5000 mg | ORAL_TABLET | Freq: Two times a day (BID) | ORAL | Status: DC
  Administered 2016-09-07 – 2016-09-11 (×9): 7.5 mg via ORAL
  Filled 2016-09-07 (×10): qty 1

## 2016-09-07 MED ORDER — ONDANSETRON HCL 4 MG PO TABS
4.0000 mg | ORAL_TABLET | Freq: Three times a day (TID) | ORAL | Status: DC | PRN
  Administered 2016-09-07 – 2016-09-08 (×2): 4 mg via ORAL
  Filled 2016-09-07 (×2): qty 1

## 2016-09-07 MED ORDER — ACETAMINOPHEN 325 MG PO TABS
650.0000 mg | ORAL_TABLET | Freq: Four times a day (QID) | ORAL | Status: DC | PRN
  Administered 2016-09-08 – 2016-09-11 (×4): 650 mg via ORAL
  Filled 2016-09-07 (×4): qty 2

## 2016-09-07 MED ORDER — TRAZODONE HCL 150 MG PO TABS
150.0000 mg | ORAL_TABLET | Freq: Every evening | ORAL | Status: DC | PRN
  Administered 2016-09-07: 150 mg via ORAL
  Filled 2016-09-07: qty 1

## 2016-09-07 MED ORDER — BUSPIRONE HCL 15 MG PO TABS
ORAL_TABLET | ORAL | Status: AC
  Administered 2016-09-07: 7.5 mg via ORAL
  Filled 2016-09-07: qty 1

## 2016-09-07 MED ORDER — PRAVASTATIN SODIUM 40 MG PO TABS
40.0000 mg | ORAL_TABLET | Freq: Every day | ORAL | Status: DC
  Administered 2016-09-08 – 2016-09-12 (×5): 40 mg via ORAL
  Filled 2016-09-07 (×6): qty 1

## 2016-09-07 MED ORDER — MAGNESIUM HYDROXIDE 400 MG/5ML PO SUSP
30.0000 mL | Freq: Every day | ORAL | Status: DC | PRN
  Administered 2016-09-11: 30 mL via ORAL
  Filled 2016-09-07: qty 30

## 2016-09-07 NOTE — Progress Notes (Addendum)
Marvin Clark is a 61 year old male being admitted voluntarily to [facility] as a [facility].  He came in with suicidal ideation with plan to OD/walk in traffic and requesting detox off of heroin and cocaine.  He has a history of depression and is being treated by his PCP.  He has a recent DUI charge and court date is 09/29/16.  He denies any A/V hallucinations or any medical issues at this time.  He appears to be in no physical distress.  He denies current SI and is able to contract for safety on the unit.  No HI.  He is diagnosed with Major Depressive Disorder and polysubstance abuse.  Oriented him to the unit.  Admission paperwork completed and signed.  Belongings searched and secured in locker # 42.  Skin assessment completed and noted small abrasion to left wrist.  Q 15 minute checks initiated for safety.  We will monitor the progress towards his goals.

## 2016-09-07 NOTE — Tx Team (Signed)
Initial Treatment Plan 09/07/2016 10:58 PM Marvin Fairlyicholas J Creasy [id number]    PATIENT STRESSORS: Financial difficulties Legal issue Substance abuse   PATIENT STRENGTHS: Capable of independent living Wellsite geologistCommunication skills General fund of knowledge Motivation for treatment/growth   PATIENT IDENTIFIED PROBLEMS: Depression  Substance abuse  Anxiety  Suicidal ideation  "Restructure my life"  "I need rehab and hopefully go after I leave here"  "I want to get well"         DISCHARGE CRITERIA:  Medical problems require only outpatient monitoring Verbal commitment to aftercare and medication compliance Withdrawal symptoms are absent or subacute and managed without 24-hour nursing intervention  PRELIMINARY DISCHARGE PLAN: Outpatient therapy Medication management, SA rehab  PATIENT/FAMILY INVOLVEMENT: This treatment plan has been presented to and reviewed with the patient, Marvin Clark.  The patient and family have been given the opportunity to ask questions and make suggestions.  Levin BaconHeather V [name], RN 09/07/2016, 10:58 PM

## 2016-09-07 NOTE — BH Assessment (Signed)
Tele Assessment Note   Marvin Clark is an 61 y.o. male who presents voluntarily accompanied by his family reporting symptoms of depression and suicidal ideation, and wanting detox from heroin and cocaine, which he has been using for 8 months. Pt has a history of depression treated by his PCP, and has a history of ETOH dependence for which he did IP treatment at [facility] 5 years ago. "I just fell in with the wrong people and started using drugs instead of alcohol". Pt states he has thought of overdoing or walking into traffic, but has no hx of attempts. Pt denies HI, AVH or hx of violence.  Pt reports medication compliance with his Cymbalta, Trazadone and Buspar, but he says it is not working. Pt states current stressors include a recent DUI with a ct date of 09/30/15, his only legal involvement  Pt denies history of abuse and trauma. Pt reports there is a family history of SA. Pt has fair insight and judgment. Pt's memory is good.  ? MSE: Pt is casually dressed, alert, oriented x4 with normal speech and normal motor behavior. Eye contact is good. Pt's mood is depressed and affect is depressed and anxious. Affect is congruent with mood. Thought process is coherent and relevant. There is no indication Pt is currently responding to internal stimuli or experiencing delusional thought content. Pt was cooperative throughout assessment. Pt is currently unable to contract for safety outside the hospital and wants inpatient psychiatric treatment.   Renata Caprice, DNP recommends [facility] treatment. Per Julieanne Cotton, pt accepted to [facility] [id number].  Diagnosis: MDD, recurrent, severe without psychotic features, Polysubstance Abuse  Past Medical History:  Past Medical History:  Diagnosis Date  . Depression 2013   . Diverticulosis of colon   . Hyperlipidemia 2013   . Low back pain 1997    had pain x 6 weeks, then had surgery, did well for 6 years. had L sided sciatica    Past Surgical History:  Procedure Laterality Date  .  LUMBAR DISC SURGERY  1997     Family History:  Family History  Problem Relation Age of Onset  . COPD Mother   . Cancer Father     Social History:  reports that he has never smoked. He has never used smokeless tobacco. He reports that he does not drink alcohol or use drugs.  Additional Social History:  Alcohol / Drug Use Pain Medications: denies Prescriptions: denies Over the Counter: denies History of alcohol / drug use?: Yes Longest period of sobriety (when/how long): years Negative Consequences of Use: Legal, Financial Withdrawal Symptoms:  (none noted) Substance #1 Name of Substance 1: heroin 1 - Age of First Use: unk 1 - Amount (size/oz): 1/2 gram  1 - Frequency: daily 1 - Duration: 8 mo 1 - Last Use / Amount: yesterday Substance #2 Name of Substance 2: cocaine 2 - Age of First Use: unk 2 - Amount (size/oz): 12/gram  2 - Frequency: daily 2 - Duration: 8 mo 2 - Last Use / Amount: yesterday  CIWA:   COWS:    PATIENT STRENGTHS: (choose at least two) Ability for insight Active sense of humor Average or above average intelligence Capable of independent living Communication skills General fund of knowledge Motivation for treatment/growth Physical Health Supportive family/friends  Allergies: No Known Allergies  Home Medications:  Medications Prior to Admission  Medication Sig Dispense Refill  . busPIRone (BUSPAR) 7.5 MG tablet TAKE 1 TABLET(7.5 MG) BY MOUTH TWICE DAILY 60 tablet 2  . cyclobenzaprine (  FLEXERIL) 5 MG tablet TAKE 1 TABLET BY MOUTH THREE TIMES DAILY AS NEEDED FOR MUSCLE SPASMS 90 tablet 0  . DULoxetine (CYMBALTA) 60 MG capsule Take 1 capsule (60 mg total) by mouth daily. 90 capsule 2  . pravastatin (PRAVACHOL) 40 MG tablet Take 1 tablet (40 mg total) by mouth daily. 90 tablet 3  . traMADol (ULTRAM) 50 MG tablet TAKE 1 TABLET BY MOUTH EVERY 12 HOURS AS NEEDED FOR MODERATE PAIN IN LOWER BACK 60 tablet 2  . traZODone (DESYREL) 150 MG tablet TAKE 1  TABLET(150 MG) BY MOUTH AT BEDTIME AS NEEDED FOR SLEEP 30 tablet 0    OB/GYN Status:  No LMP for male patient.  General Assessment Data Location of Assessment: [facility] TTS Assessment: In system Is this a Tele or Face-to-Face Assessment?: Face-to-Face Is this an Initial Assessment or a Re-assessment for this encounter?: Initial Assessment Marital status: Single Is patient pregnant?: No Pregnancy Status: No Living Arrangements: Alone Can pt return to current living arrangement?: Yes Admission Status: Voluntary Is patient capable of signing voluntary admission?: Yes Referral Source: Self/Family/Friend Insurance type: [employer]  Medical Screening Exam [facility] Walk-in ONLY) Medical Exam completed: Yes  Crisis Care Plan Living Arrangements: Alone Name of Psychiatrist:  (none) Name of Therapist: none  Education Status Is patient currently in school?: No  Risk to self with the past 6 months Suicidal Ideation: Yes-Currently Present Has patient been a risk to self within the past 6 months prior to admission? : No Suicidal Intent: No Has patient had any suicidal intent within the past 6 months prior to admission? : No Is patient at risk for suicide?: Yes Suicidal Plan?: Yes-Currently Present Has patient had any suicidal plan within the past 6 months prior to admission? : Yes Specify Current Suicidal Plan:  (walk in to traffic, OD) Access to Means: Yes Specify Access to Suicidal Means:  (environment) What has been your use of drugs/alcohol within the last 12 months?:  (see SA section) Previous Attempts/Gestures: No Other Self Harm Risks:  (none known) Intentional Self Injurious Behavior: None Family Suicide History: No Recent stressful life event(s): Legal Issues, Financial Problems (DUI) Persecutory voices/beliefs?: No Depression: Yes Depression Symptoms: Isolating, Fatigue, Guilt, Loss of interest in usual pleasures, Feeling worthless/self pity,  Tearfulness Substance abuse history and/or treatment for substance abuse?: Yes Suicide prevention information given to non-admitted patients: Not applicable  Risk to Others within the past 6 months Homicidal Ideation: No Does patient have any lifetime risk of violence toward others beyond the six months prior to admission? : No Thoughts of Harm to Others: No Current Homicidal Intent: No Current Homicidal Plan: No Access to Homicidal Means: No History of harm to others?: No Assessment of Violence: None Noted Does patient have access to weapons?: No Criminal Charges Pending?: Yes Describe Pending Criminal Charges: DUI Does patient have a court date: Yes Court Date: 09/29/16 Is patient on probation?: No  Psychosis Hallucinations: None noted Delusions: None noted  Mental Status Report Appearance/Hygiene: Unremarkable Eye Contact: Good Motor Activity: Unremarkable Speech: Logical/coherent Level of Consciousness: Alert Mood: Depressed, Sad Affect: Sad, Depressed Anxiety Level: Minimal Thought Processes: Coherent, Relevant Judgement: Unimpaired Orientation: Person, Place, Time, Situation, Appropriate for developmental age Obsessive Compulsive Thoughts/Behaviors: None  Cognitive Functioning Concentration: Fair Memory: Recent Intact, Remote Intact IQ: Average Insight: Fair Impulse Control: Fair Appetite: Good Weight Loss: 0 Weight Gain: 0 Sleep: No Change Total Hours of Sleep: 8 (with Trazadone) Vegetative Symptoms: None  ADLScreening [facility] Assessment Services) Patient's cognitive ability adequate  to safely complete daily activities?: Yes Patient able to express need for assistance with ADLs?: Yes Independently performs ADLs?: Yes (appropriate for developmental age)  Prior Inpatient Therapy Prior Inpatient Therapy: Yes Prior Therapy Dates: 5 yrs ago Prior Therapy Facilty/Provider(s): [facility] Reason for Treatment: ETOH  Prior Outpatient Therapy Prior Outpatient  Therapy: Yes Prior Therapy Dates: ongoing Prior Therapy Facilty/Provider(s): His PCP Reason for Treatment: depression Does patient have an ACCT team?: No Does patient have Intensive In-House Services?  : No Does patient have [facility] services? : No Does patient have P4CC services?: No  ADL Screening (condition at time of admission) Patient's cognitive ability adequate to safely complete daily activities?: Yes Is the patient deaf or have difficulty hearing?: No Does the patient have difficulty seeing, even when wearing glasses/contacts?: No Does the patient have difficulty concentrating, remembering, or making decisions?: No Patient able to express need for assistance with ADLs?: Yes Does the patient have difficulty dressing or bathing?: No Independently performs ADLs?: Yes (appropriate for developmental age) Does the patient have difficulty walking or climbing stairs?: No Weakness of Legs: None Weakness of Arms/Hands: None  Home Assistive Devices/Equipment Home Assistive Devices/Equipment: None    Abuse/Neglect Assessment (Assessment to be complete while patient is alone) Physical Abuse: Denies Verbal Abuse: Denies Sexual Abuse: Denies Exploitation of patient/patient's resources: Denies Self-Neglect: Denies Values / Beliefs Cultural Requests During Hospitalization: None Spiritual Requests During Hospitalization: None   Advance Directives (For Healthcare) Does Patient Have a Medical Advance Directive?: No Would patient like information on creating a medical advance directive?: No - Patient declined    Additional Information 1:1 In Past 12 Months?: No CIRT Risk: No Elopement Risk: No Does patient have medical clearance?: No     Disposition:  Disposition Initial Assessment Completed for this Encounter: Yes Disposition of Patient: Inpatient treatment program Type of inpatient treatment program: Adult  [facility], [place],Ares Tegtmeyer Hines 09/07/2016 6:18 PM

## 2016-09-08 DIAGNOSIS — F1124 Opioid dependence with opioid-induced mood disorder: Secondary | ICD-10-CM | POA: Diagnosis present

## 2016-09-08 LAB — COMPREHENSIVE METABOLIC PANEL
ALBUMIN: 4.3 g/dL (ref 3.5–5.0)
ALT: 17 U/L (ref 17–63)
ANION GAP: 9 (ref 5–15)
AST: 20 U/L (ref 15–41)
Alkaline Phosphatase: 65 U/L (ref 38–126)
BILIRUBIN TOTAL: 0.4 mg/dL (ref 0.3–1.2)
BUN: 10 mg/dL (ref 6–20)
CO2: 28 mmol/L (ref 22–32)
Calcium: 9.7 mg/dL (ref 8.9–10.3)
Chloride: 105 mmol/L (ref 101–111)
Creatinine, Ser: 0.87 mg/dL (ref 0.61–1.24)
GFR calc Af Amer: >60 mL/min (ref >60)
GFR calc non Af Amer: >60 mL/min (ref >60)
GLUCOSE: 140 mg/dL — AB (ref 65–99)
POTASSIUM: 4.1 mmol/L (ref 3.5–5.1)
Sodium: 142 mmol/L (ref 135–145)
TOTAL PROTEIN: 6.8 g/dL (ref 6.5–8.1)

## 2016-09-08 LAB — CBC
HEMATOCRIT: 45.1 % (ref 39.0–52.0)
Hemoglobin: 15.3 g/dL (ref 13.0–17.0)
MCH: 29 pg (ref 26.0–34.0)
MCHC: 33.9 g/dL (ref 30.0–36.0)
MCV: 85.6 fL (ref 78.0–100.0)
PLATELETS: 269 10*3/uL (ref 150–400)
RBC: 5.27 MIL/uL (ref 4.22–5.81)
RDW: 12.3 % (ref 11.5–15.5)
WBC: 8 10*3/uL (ref 4.0–10.5)

## 2016-09-08 LAB — RAPID URINE DRUG SCREEN, HOSP PERFORMED
AMPHETAMINES: NOT DETECTED
BENZODIAZEPINES: NOT DETECTED
Barbiturates: NOT DETECTED
Cocaine: NOT DETECTED
OPIATES: NOT DETECTED
Tetrahydrocannabinol: NOT DETECTED

## 2016-09-08 LAB — LIPID PANEL
CHOLESTEROL: 182 mg/dL (ref 0–200)
HDL: 58 mg/dL (ref >40)
LDL CALC: 99 mg/dL (ref 0–99)
Total CHOL/HDL Ratio: 3.1 RATIO
Triglycerides: 124 mg/dL (ref <150)
VLDL: 25 mg/dL (ref 0–40)

## 2016-09-08 MED ORDER — CLONIDINE HCL 0.1 MG PO TABS
0.1000 mg | ORAL_TABLET | ORAL | Status: AC
  Administered 2016-09-10 – 2016-09-11 (×4): 0.1 mg via ORAL
  Filled 2016-09-08 (×4): qty 1

## 2016-09-08 MED ORDER — METHOCARBAMOL 500 MG PO TABS
500.0000 mg | ORAL_TABLET | Freq: Three times a day (TID) | ORAL | Status: DC | PRN
  Administered 2016-09-10 – 2016-09-11 (×4): 500 mg via ORAL
  Filled 2016-09-08 (×4): qty 1

## 2016-09-08 MED ORDER — HYDROXYZINE HCL 25 MG PO TABS
25.0000 mg | ORAL_TABLET | Freq: Four times a day (QID) | ORAL | Status: DC | PRN
  Administered 2016-09-08 – 2016-09-10 (×2): 25 mg via ORAL
  Filled 2016-09-08: qty 1
  Filled 2016-09-08: qty 20
  Filled 2016-09-08 (×2): qty 1

## 2016-09-08 MED ORDER — ONDANSETRON 4 MG PO TBDP
4.0000 mg | ORAL_TABLET | Freq: Four times a day (QID) | ORAL | Status: DC | PRN
  Administered 2016-09-09 – 2016-09-11 (×4): 4 mg via ORAL
  Filled 2016-09-08 (×4): qty 1

## 2016-09-08 MED ORDER — CLONIDINE HCL 0.1 MG PO TABS
0.1000 mg | ORAL_TABLET | Freq: Every day | ORAL | Status: DC
  Administered 2016-09-12: 0.1 mg via ORAL
  Filled 2016-09-08 (×2): qty 1

## 2016-09-08 MED ORDER — LOPERAMIDE HCL 2 MG PO CAPS: 2.0000 mg | ORAL_CAPSULE | ORAL | Status: DC | PRN

## 2016-09-08 MED ORDER — NAPROXEN 500 MG PO TABS
500.0000 mg | ORAL_TABLET | Freq: Two times a day (BID) | ORAL | Status: DC | PRN
  Administered 2016-09-10 – 2016-09-11 (×3): 500 mg via ORAL
  Filled 2016-09-08 (×3): qty 1

## 2016-09-08 MED ORDER — TRAZODONE HCL 100 MG PO TABS
100.0000 mg | ORAL_TABLET | Freq: Every evening | ORAL | Status: DC | PRN
  Administered 2016-09-08: 100 mg via ORAL
  Filled 2016-09-08: qty 1

## 2016-09-08 MED ORDER — CLONIDINE HCL 0.1 MG PO TABS
0.1000 mg | ORAL_TABLET | Freq: Four times a day (QID) | ORAL | Status: AC
  Administered 2016-09-08 – 2016-09-10 (×7): 0.1 mg via ORAL
  Filled 2016-09-08 (×8): qty 1

## 2016-09-08 MED ORDER — DICYCLOMINE HCL 20 MG PO TABS: 20.0000 mg | ORAL_TABLET | Freq: Four times a day (QID) | ORAL | Status: DC | PRN

## 2016-09-08 NOTE — Progress Notes (Signed)
D: Patient pleasant and cheerful. Stated "I just got admitted couple of hrs ago. Trying to get use to the environment". Patient requested for a meal - "I didn't get to eat my dinner before now". Staff offered him a dinner tray. Reports anxiety and nausea. Denies SI/HI, AH/VH. No further complaints. Visible on day room at all times.  A: Staff offered support and encouragement as needed. Routine safety checks maintained. Will continue to monitor patient. R: Patient remains safe.

## 2016-09-08 NOTE — Tx Team (Signed)
Interdisciplinary Treatment and Diagnostic Plan Update  09/08/2016 Time of Session: 4:09 PM  Marvin Clark MRN: [id number]  Principal Diagnosis: Opioid dependence with opioid-induced mood disorder ([place])  Secondary Diagnoses: Principal Problem:   Opioid dependence with opioid-induced mood disorder ([place]) Active Problems:   Major depressive disorder without psychotic features   Current Medications:  Current Facility-Administered Medications  Medication Dose Route Frequency Provider Last Rate Last Dose  . acetaminophen (TYLENOL) tablet 650 mg  650 mg Oral Q6H PRN Rozetta Nunnery, NP   650 mg at 09/08/16 0755  . alum & mag hydroxide-simeth (MAALOX/MYLANTA) 200-200-20 MG/5ML suspension 30 mL  30 mL Oral Q4H PRN Rozetta Nunnery, NP      . busPIRone (BUSPAR) tablet 7.5 mg  7.5 mg Oral BID Rozetta Nunnery, NP   7.5 mg at 09/08/16 1604  . cloNIDine (CATAPRES) tablet 0.1 mg  0.1 mg Oral QID Encarnacion Slates, NP   0.1 mg at 09/08/16 1604   Followed by  . [START ON 09/10/2016] cloNIDine (CATAPRES) tablet 0.1 mg  0.1 mg Oral BH-qamhs Encarnacion Slates, NP       Followed by  . [START ON 09/12/2016] cloNIDine (CATAPRES) tablet 0.1 mg  0.1 mg Oral QAC breakfast Encarnacion Slates, NP      . dicyclomine (BENTYL) tablet 20 mg  20 mg Oral Q6H PRN Encarnacion Slates, NP      . DULoxetine (CYMBALTA) DR capsule 60 mg  60 mg Oral Daily Rozetta Nunnery, NP   60 mg at 09/08/16 0757  . hydrOXYzine (ATARAX/VISTARIL) tablet 25 mg  25 mg Oral Q6H PRN Encarnacion Slates, NP      . loperamide (IMODIUM) capsule 2-4 mg  2-4 mg Oral PRN Encarnacion Slates, NP      . magnesium hydroxide (MILK OF MAGNESIA) suspension 30 mL  30 mL Oral Daily PRN Rozetta Nunnery, NP      . methocarbamol (ROBAXIN) tablet 500 mg  500 mg Oral Q8H PRN Encarnacion Slates, NP      . naproxen (NAPROSYN) tablet 500 mg  500 mg Oral BID PRN Encarnacion Slates, NP      . ondansetron (ZOFRAN-ODT) disintegrating tablet 4 mg  4 mg Oral Q6H PRN Encarnacion Slates, NP      . pravastatin (PRAVACHOL) tablet  40 mg  40 mg Oral Daily Rozetta Nunnery, NP   40 mg at 09/08/16 0817  . traZODone (DESYREL) tablet 100 mg  100 mg Oral QHS PRN Jenne Campus, MD        PTA Medications: Prescriptions Prior to Admission  Medication Sig Dispense Refill Last Dose  . busPIRone (BUSPAR) 7.5 MG tablet TAKE 1 TABLET(7.5 MG) BY MOUTH TWICE DAILY 60 tablet 2 09/06/2016 at 0900  . cyclobenzaprine (FLEXERIL) 5 MG tablet TAKE 1 TABLET BY MOUTH THREE TIMES DAILY AS NEEDED FOR MUSCLE SPASMS 90 tablet 0 Past Week at Unknown time  . DULoxetine (CYMBALTA) 60 MG capsule Take 1 capsule (60 mg total) by mouth daily. 90 capsule 2 09/07/2016 at 0900  . pravastatin (PRAVACHOL) 40 MG tablet Take 1 tablet (40 mg total) by mouth daily. 90 tablet 3 09/07/2016 at 0900  . traMADol (ULTRAM) 50 MG tablet TAKE 1 TABLET BY MOUTH EVERY 12 HOURS AS NEEDED FOR MODERATE PAIN IN LOWER BACK 60 tablet 2 Past Week at Unknown time  . traZODone (DESYREL) 150 MG tablet TAKE 1 TABLET(150 MG) BY MOUTH AT BEDTIME AS NEEDED FOR SLEEP  30 tablet 0 09/06/2016    Treatment Modalities: Medication Management, Group therapy, Case management,  1 to 1 session with clinician, Psychoeducation, Recreational therapy.  Patient Stressors: Arts development officer issue Substance abuse  Patient Strengths: Capable of independent living Curator fund of knowledge Motivation for treatment/growth  Physician Treatment Plan for Primary Diagnosis: Opioid dependence with opioid-induced mood disorder ([place]) Long Term Goal(s): Improvement in symptoms so as ready for discharge  Short Term Goals: Ability to identify changes in lifestyle to reduce recurrence of condition will improve Ability to verbalize feelings will improve Ability to disclose and discuss suicidal ideas Ability to demonstrate self-control will improve Ability to identify and develop effective coping behaviors will improve Ability to verbalize feelings will improve Ability to disclose and  discuss suicidal ideas Ability to demonstrate self-control will improve Ability to identify and develop effective coping behaviors will improve Compliance with prescribed medications will improve Ability to identify triggers associated with substance abuse/mental health issues will improve  Medication Management: Evaluate patient's response, side effects, and tolerance of medication regimen.  Therapeutic Interventions: 1 to 1 sessions, Unit Group sessions and Medication administration.  Evaluation of Outcomes: Not Met  Physician Treatment Plan for Secondary Diagnosis: Principal Problem:   Opioid dependence with opioid-induced mood disorder (HCC) Active Problems:   Major depressive disorder without psychotic features   Long Term Goal(s): Improvement in symptoms so as ready for discharge  Short Term Goals: Ability to identify changes in lifestyle to reduce recurrence of condition will improve Ability to verbalize feelings will improve Ability to disclose and discuss suicidal ideas Ability to demonstrate self-control will improve Ability to identify and develop effective coping behaviors will improve Ability to verbalize feelings will improve Ability to disclose and discuss suicidal ideas Ability to demonstrate self-control will improve Ability to identify and develop effective coping behaviors will improve Compliance with prescribed medications will improve Ability to identify triggers associated with substance abuse/mental health issues will improve  Medication Management: Evaluate patient's response, side effects, and tolerance of medication regimen.  Therapeutic Interventions: 1 to 1 sessions, Unit Group sessions and Medication administration.  Evaluation of Outcomes: Not Met   RN Treatment Plan for Primary Diagnosis: Opioid dependence with opioid-induced mood disorder ([place]) Long Term Goal(s): Knowledge of disease and therapeutic regimen to maintain health will  improve  Short Term Goals: Ability to verbalize feelings will improve, Ability to disclose and discuss suicidal ideas and Ability to identify and develop effective coping behaviors will improve  Medication Management: RN will administer medications as ordered by provider, will assess and evaluate patient's response and provide education to patient for prescribed medication. RN will report any adverse and/or side effects to prescribing provider.  Therapeutic Interventions: 1 on 1 counseling sessions, Psychoeducation, Medication administration, Evaluate responses to treatment, Monitor vital signs and CBGs as ordered, Perform/monitor CIWA, COWS, AIMS and Fall Risk screenings as ordered, Perform wound care treatments as ordered.  Evaluation of Outcomes: Not Met   LCSW Treatment Plan for Primary Diagnosis: Opioid dependence with opioid-induced mood disorder ([place]) Long Term Goal(s): Safe transition to appropriate next level of care at discharge, Engage patient in therapeutic group addressing interpersonal concerns.  Short Term Goals: Engage patient in aftercare planning with referrals and resources, Facilitate patient progression through stages of change regarding substance use diagnoses and concerns, Identify triggers associated with mental health/substance abuse issues and Increase skills for wellness and recovery  Therapeutic Interventions: Assess for all discharge needs, 1 to 1 time with Social worker, Explore available resources  and support systems, Assess for adequacy in community support network, Educate family and significant other(s) on suicide prevention, Complete Psychosocial Assessment, Interpersonal group therapy.  Evaluation of Outcomes: Not Met   Progress in Treatment: Attending groups: Pt is new to milieu, continuing to assess  Participating in groups: Pt is new to milieu, continuing to assess  Taking medication as prescribed: Yes, MD continues to assess for medication changes as  needed Toleration medication: Yes, no side effects reported at this time Family/Significant other contact made: No, CSW attempting to make contact with ex-wife Patient understands diagnosis: Continuing to assess Discussing patient identified problems/goals with staff: Yes Medical problems stabilized or resolved: Yes Denies suicidal/homicidal ideation: Yes Issues/concerns per patient self-inventory: None Other: N/A  New problem(s) identified: None identified at this time.   New Short Term/Long Term Goal(s): None identified at this time.   Discharge Plan or Barriers: Pt requesting referrals to residential treatment. CSW to facilitate appropriate referrals. However, Pt has a court date on 1/26 that may impact his ability to enter treatment.   Reason for Continuation of Hospitalization: Anxiety Depression Medication stabilization Suicidal ideation Withdrawal symptoms  Estimated Length of Stay: 3-5 days  Attendees: Patient: 09/08/2016  4:09 PM  Physician: Dr. Parke Poisson; Dr. Shea Evans 09/08/2016  4:09 PM  Nursing: Cardell Peach, Darrol Angel, RN 09/08/2016  4:09 PM  RN Care Manager: Lars Pinks, RN 09/08/2016  4:09 PM  Social Worker: Adriana Reams, LCSW; [place], LCSW 09/08/2016  4:09 PM  Recreational Therapist:  09/08/2016  4:09 PM  Other: Lindell Spar, NP 09/08/2016  4:09 PM  Other:  09/08/2016  4:09 PM  Other: 09/08/2016  4:09 PM    Scribe for Treatment Team: Gladstone Lighter, LCSW 09/08/2016 4:09 PM

## 2016-09-08 NOTE — [facility] Suicide Risk Assessment (Signed)
[facility]   Nursing information obtained from:  Patient Demographic factors:  Male, Cardell PeachGay, lesbian, or bisexual orientation Current Mental Status:  Suicidal ideation indicated by patient Loss Factors:  Legal issues, Financial problems / change in socioeconomic status Historical Factors:  NA Risk Reduction Factors:  Positive therapeutic relationship  Total Time spent with patient: 45 minutes Principal Problem:  Opiate Dependence, MDD  Diagnosis:   Patient Active Problem List   Diagnosis Date Noted  . 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 BPH (benign prostatic hyperplasia) [N40.0] 09/07/2014  . History of alcohol dependence (HCC) [F10.21] 02/01/2011  . CERVICAL MUSCLE STRAIN [S13.9XXA] 08/23/2010  . High cholesterol [E78.00] 07/08/2007  . Depression [F32.9] 07/08/2007  . EXTERNAL HEMORRHOIDS [K64.4] 07/08/2007  . DIVERTICULOSIS, COLON [K57.30] 07/08/2007  . DISC DISEASE, LUMBAR [M51.37] 07/08/2007     Continued Clinical Symptoms:  Alcohol Use Disorder Identification Test Final Score (AUDIT): 0 The "Alcohol Use Disorders Identification Test", Guidelines for Use in Primary Care, Second Edition.  World Science writerHealth [employer] [facility](WHO). Score between 0-7:  no or low risk or alcohol related problems. Score between 8-15:  moderate risk of alcohol related problems. Score between 16-19:  high risk of alcohol related problems. Score 20 or above:  warrants further diagnostic evaluation for alcohol dependence and treatment.   CLINICAL FACTORS:  61 year old male, reports history of opiate dependence, has been using heroin ( insufflated- denies IVDA). Reports worsening depression, significant subjective sense of guilt associated with drug abuse, and recent suicidal ideations, with thoughts of walking into traffic. At this time presents depressed, sad, but denies any active suicidal ideations and expresses motivation in treatment and in  working on recovery. (+) symptoms of opiate withdrawal- reports feeling hot and cold, rhinorrhea, aches, nausea, but no vomiting . Vitals are stable. Dx- Opiate Dependence, Opiate Induced Mood Disorder Plan- Inpatient admission , Clonidine detox protocol, continue Cymbalta and Buspar trials- denies any side effects thus far .    Musculoskeletal: Strength & Muscle Tone: within normal limits Gait & Station: normal Patient leans: N/A  Psychiatric Specialty Exam: Physical Exam  ROS diffuse discomfort , aches, nausea, feeling hot and cold, no vomiting   Blood pressure 129/75, pulse 91, temperature 97.9 F (36.6 C), temperature source Oral, resp. rate 16, height 5' 9.29" (1.76 m), weight 80.7 kg (177 lb 14.4 oz).Body mass index is 26.05 kg/m.  General Appearance: Fairly Groomed  Eye Contact:  Fair  Speech:  Normal Rate  Volume:  Decreased  Mood:  Depressed and Dysphoric  Affect:  Constricted  Thought Process:  Linear  Orientation:  Other:  fully alert and attentive   Thought Content:  linear   Suicidal Thoughts:  No at this time denies suicidal plan or intention and is able to contract for safety on the unit  Homicidal Thoughts:  No denies homicidal or violent ideations  Memory:  recent and remote grossly intact   Judgement:  Other:  improving   Insight:  Present  Psychomotor Activity:  slight restlessness, no tremors  Concentration:  Concentration: Good and Attention Span: Good  Recall:  Good  Fund of Knowledge:  Good  Language:  Good  Akathisia:  Negative  Handed:  Right  AIMS (if indicated):     Assets:  Desire for Improvement Resilience  ADL's:  Fair   Cognition:  WNL  Sleep:  Number of Hours: 5.75      COGNITIVE FEATURES THAT CONTRIBUTE TO RISK:  Closed-mindedness and Loss of  executive function    SUICIDE RISK:   Moderate:  Frequent suicidal ideation with limited intensity, and duration, some specificity in terms of plans, no associated intent, good self-control,  limited dysphoria/symptomatology, some risk factors present, and identifiable protective factors, including available and accessible social support.   PLAN OF CARE: Patient will be admitted to inpatient psychiatric unit for stabilization and safety. Will provide and encourage milieu participation. Provide medication management and maked adjustments as needed.  Will also provide medication management to minimize risk of opiate withdrawal symptoms. Will follow daily.    I certify that inpatient services furnished can reasonably be expected to improve the patient's condition.  Nehemiah Massed, MD 09/08/2016, 2:14 PM

## 2016-09-08 NOTE — BHH Counselor (Signed)
CSW attempted to meet with Pt do complete PSA, however he requested that writer come back later due to withdrawal symptoms. CSW will attempt later.   Marvin ShanksLauren Cece Milhouse, LCSW Clinical Social Work [phone]

## 2016-09-08 NOTE — H&P (Signed)
Psychiatric Admission Assessment Adult  Patient Identification: Marvin Clark  MRN:  [id number]  Date of Evaluation:  09/08/2016  Chief Complaint: Worsening symptoms of depression triggering suicidal ideation.  Principal Diagnosis: Opioid use disorder, dependent, severe, Major depressive disorder, recurrent episodes, severe.  Diagnosis:   Patient Active Problem List   Diagnosis Date Noted  . 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 BPH (benign prostatic hyperplasia) [N40.0] 09/07/2014  . History of alcohol dependence ([place]) [F10.21] 02/01/2011  . CERVICAL MUSCLE STRAIN [S13.9XXA] 08/23/2010  . High cholesterol [E78.00] 07/08/2007  . Depression [F32.9] 07/08/2007  . EXTERNAL HEMORRHOIDS [K64.4] 07/08/2007  . DIVERTICULOSIS, COLON [K57.30] 07/08/2007  . DISC DISEASE, LUMBAR [M51.37] 07/08/2007   History of Present Illness: This is an admission assessment for this 61 year old Caucasian male with hx of  Alcohol, Cocaine & Opioid use disorder. Admitted to the [facility] adult unit as a walk-in with complaints of worsening symptoms of depression & suicidal ideations with plans to walk out in front of a moving traffic. He states that the suicidal ideations has been going on for about a couple of weeks, triggered by his bad drug use. He says he has been abusing (snorting) heroin x 8 months. Marvin Clark states that he first relapsed on Cocaine after 16 years sobriety. He states his relapse was as a result of stupidity & hanging around the wrong crowd. He adds that he did have alcohol problems as well, but went to the [facility] & has remained sober from alcohol for 6 years. Marvin Clark says he has had depression most of his life. Has been on Cymbalta, Trazodone & Buspar x 8 years. He is being seen at the [facility] by Dr. Karleen Hampshire. He is currently endorsing opioid withdrawal symptoms; sniffles, body aches, nausea. Marvin Clark says  would like to remain on his previous psychotropic medications. Would want to go into a rehab facility after discharge. Denies any hx of suicide attempts.  Associated Signs/Symptoms:  Depression Symptoms:  depressed mood, insomnia, psychomotor agitation, feelings of worthlessness/guilt, hopelessness,  (Hypo) Manic Symptoms:  Impulsivity, Irritable Mood,  Anxiety Symptoms:  Excessive Worry,  Psychotic Symptoms:  Denies any hallucinations, delusional thoughts or paranoia.  PTSD Symptoms: Denies any PTSD symptoms or events.  Total Time spent with patient: 1 hour  Past Psychiatric History: Major depressive disorder, recurrent episodes, Opioid use disorder, severe.  Is the patient at risk to self? Yes.    Has the patient been a risk to self in the past 6 months? No.  Has the patient been a risk to self within the distant past? No.  Is the patient a risk to others? No.  Has the patient been a risk to others in the past 6 months? No.  Has the patient been a risk to others within the distant past? No.   Prior Inpatient Therapy: Prior Inpatient Therapy: Yes Prior Therapy Dates: 5 yrs ago Prior Therapy Facilty/Provider(s): [facility] Reason for Treatment: ETOH Prior Outpatient Therapy: Prior Outpatient Therapy: Yes Prior Therapy Dates: ongoing Prior Therapy Facilty/Provider(s): His PCP Reason for Treatment: depression Does patient have an ACCT team?: No Does patient have Intensive In-House Services?  : No Does patient have [facility] services? : No Does patient have P4CC services?: No  Alcohol Screening: 1. How often do you have a drink containing alcohol?: Never 9. Have you or someone else been injured as a result of your drinking?: No 10. Has a relative or friend or a doctor or  another Economist been concerned about your drinking or suggested you cut down?: No Alcohol Use Disorder Identification Test Final Score (AUDIT): 0 Brief Intervention: AUDIT score less than 7 or  less-screening does not suggest unhealthy drinking-brief intervention not indicated  Substance Abuse History in the last 12 months:  Yes.    Consequences of Substance Abuse: Medical Consequences:  Liver damage, Possible death by overdose Legal Consequences:  Arrests, jail time, Loss of driving privilege. Family Consequences:  Family discord, divorce and or separation.  Previous Psychotropic Medications: Yes, (Cymbalta 60 mg, Buspar & Trazodone).  Psychological Evaluations: Yes   Past Medical History:  Past Medical History:  Diagnosis Date  . Depression 2013   . Diverticulosis of colon   . Hyperlipidemia 2013   . Low back pain 1997    had pain x 6 weeks, then had surgery, did well for 6 years. had L sided sciatica    Past Surgical History:  Procedure Laterality Date  . LUMBAR DISC SURGERY  1997    Family History:  Family History  Problem Relation Age of Onset  . COPD Mother   . Cancer Father    Family Psychiatric  History: Alcoholism: Both parents.  Tobacco Screening: Have you used any form of tobacco in the last 30 days? (Cigarettes, Smokeless Tobacco, Cigars, and/or Pipes): No  Social History:  History  Alcohol Use No     History  Drug Use No    Additional Social History: Marital status: Single    Pain Medications: denies Prescriptions: denies Over the Counter: denies History of alcohol / drug use?: Yes Longest period of sobriety (when/how long): years Negative Consequences of Use: Legal, Financial Withdrawal Symptoms:  (none noted) Name of Substance 1: heroin 1 - Age of First Use: unk 1 - Amount (size/oz): 1/2 gram  1 - Frequency: daily 1 - Duration: 8 mo 1 - Last Use / Amount: yesterday Name of Substance 2: cocaine 2 - Age of First Use: unk 2 - Amount (size/oz): 12/gram  2 - Frequency: daily 2 - Duration: 8 mo 2 - Last Use / Amount: yesterday  Allergies:  No Known Allergies  Lab Results:  Results for orders placed or performed during the  hospital encounter of 09/07/16 (from the past 48 hour(s))  Urine rapid drug screen (hosp performed)not at [facility]     Status: None   Collection Time: 09/08/16  6:15 AM  Result Value Ref Range   Opiates NONE DETECTED NONE DETECTED   Cocaine NONE DETECTED NONE DETECTED   Benzodiazepines NONE DETECTED NONE DETECTED   Amphetamines NONE DETECTED NONE DETECTED   Tetrahydrocannabinol NONE DETECTED NONE DETECTED   Barbiturates NONE DETECTED NONE DETECTED    Comment:        DRUG SCREEN [place].  IF CONFIRMATION IS NEEDED FOR ANY PURPOSE, NOTIFY LAB WITHIN 5 DAYS.        LOWEST DETECTABLE LIMITS FOR URINE DRUG SCREEN Drug Class       Cutoff (ng/mL) Amphetamine      1000 Barbiturate      200 Benzodiazepine   022 Tricyclics       336 Opiates          300 Cocaine          300 THC              50 Performed at [facility]   CBC     Status: None   Collection Time: 09/08/16  6:32 AM  Result Value Ref Range   WBC 8.0 4.0 - 10.5 K/uL   RBC 5.27 4.22 - 5.81 MIL/uL   Hemoglobin 15.3 13.0 - 17.0 g/dL   HCT 45.1 39.0 - 52.0 %   MCV 85.6 78.0 - 100.0 fL   MCH 29.0 26.0 - 34.0 pg   MCHC 33.9 30.0 - 36.0 g/dL   RDW 12.3 11.5 - 15.5 %   Platelets 269 150 - 400 K/uL    Comment: Performed at [facility]  Comprehensive metabolic panel     Status: Abnormal   Collection Time: 09/08/16  6:32 AM  Result Value Ref Range   Sodium 142 135 - 145 mmol/L   Potassium 4.1 3.5 - 5.1 mmol/L   Chloride 105 101 - 111 mmol/L   CO2 28 22 - 32 mmol/L   Glucose, Bld 140 (H) 65 - 99 mg/dL   BUN 10 6 - 20 mg/dL   Creatinine, Ser 0.87 0.61 - 1.24 mg/dL   Calcium 9.7 8.9 - 10.3 mg/dL   Total Protein 6.8 6.5 - 8.1 g/dL   Albumin 4.3 3.5 - 5.0 g/dL   AST 20 15 - 41 U/L   ALT 17 17 - 63 U/L   Alkaline Phosphatase 65 38 - 126 U/L   Total Bilirubin 0.4 0.3 - 1.2 mg/dL   GFR calc non Af Amer >60 >60 mL/min   GFR calc Af Amer >60 >60 mL/min    Comment: (NOTE) The  eGFR has been calculated using the CKD EPI equation. This calculation has not been validated in all clinical situations. eGFR's persistently <60 mL/min signify possible Chronic Kidney Disease.    Anion gap 9 5 - 15    Comment: Performed at [facility]  Lipid panel     Status: None   Collection Time: 09/08/16  6:32 AM  Result Value Ref Range   Cholesterol 182 0 - 200 mg/dL   Triglycerides 124 <150 mg/dL   HDL 58 >40 mg/dL   Total CHOL/HDL Ratio 3.1 RATIO   VLDL 25 0 - 40 mg/dL   LDL Cholesterol 99 0 - 99 mg/dL    Comment:        Total Cholesterol/HDL:CHD Risk Coronary Heart Disease Risk Table                     Men   Women  1/2 Average Risk   3.4   3.3  Average Risk       5.0   4.4  2 X Average Risk   9.6   7.1  3 X Average Risk  23.4   11.0        Use the calculated Patient Ratio above and the CHD Risk Table to determine the patient's CHD Risk.        ATP III CLASSIFICATION (LDL):  <100     mg/dL   Optimal  100-129  mg/dL   Near or Above                    Optimal  130-159  mg/dL   Borderline  160-189  mg/dL   High  >190     mg/dL   Very High Performed at [facility]    Blood Alcohol level:  No results found for: [facility]  Metabolic Disorder Labs:  Lab Results  Component Value Date   HGBA1C 5.30 09/23/2015   MPG 111 08/19/2013   No results found for: PROLACTIN Lab Results  Component Value Date   CHOL 182 09/08/2016   TRIG 124 09/08/2016   HDL 58 09/08/2016   CHOLHDL 3.1 09/08/2016   VLDL 25 09/08/2016   LDLCALC 99 09/08/2016   LDLCALC 99 09/17/2014   Current Medications: Current Facility-Administered Medications  Medication Dose Route Frequency Provider Last Rate Last Dose  . acetaminophen (TYLENOL) tablet 650 mg  650 mg Oral Q6H PRN Rozetta Nunnery, NP   650 mg at 09/08/16 0755  . alum & mag hydroxide-simeth (MAALOX/MYLANTA) 200-200-20 MG/5ML suspension 30 mL  30 mL Oral Q4H PRN Rozetta Nunnery, NP      . busPIRone (BUSPAR) tablet  7.5 mg  7.5 mg Oral BID Rozetta Nunnery, NP   7.5 mg at 09/08/16 0757  . DULoxetine (CYMBALTA) DR capsule 60 mg  60 mg Oral Daily Rozetta Nunnery, NP   60 mg at 09/08/16 0757  . magnesium hydroxide (MILK OF MAGNESIA) suspension 30 mL  30 mL Oral Daily PRN Rozetta Nunnery, NP      . ondansetron (ZOFRAN) tablet 4 mg  4 mg Oral Q8H PRN Rozetta Nunnery, NP   4 mg at 09/08/16 4081  . pravastatin (PRAVACHOL) tablet 40 mg  40 mg Oral Daily Rozetta Nunnery, NP   40 mg at 09/08/16 0817  . traZODone (DESYREL) tablet 150 mg  150 mg Oral QHS PRN Rozetta Nunnery, NP   150 mg at 09/07/16 2305   PTA Medications: Prescriptions Prior to Admission  Medication Sig Dispense Refill Last Dose  . busPIRone (BUSPAR) 7.5 MG tablet TAKE 1 TABLET(7.5 MG) BY MOUTH TWICE DAILY 60 tablet 2 09/06/2016 at 0900  . cyclobenzaprine (FLEXERIL) 5 MG tablet TAKE 1 TABLET BY MOUTH THREE TIMES DAILY AS NEEDED FOR MUSCLE SPASMS 90 tablet 0 Past Week at Unknown time  . DULoxetine (CYMBALTA) 60 MG capsule Take 1 capsule (60 mg total) by mouth daily. 90 capsule 2 09/07/2016 at 0900  . pravastatin (PRAVACHOL) 40 MG tablet Take 1 tablet (40 mg total) by mouth daily. 90 tablet 3 09/07/2016 at 0900  . traMADol (ULTRAM) 50 MG tablet TAKE 1 TABLET BY MOUTH EVERY 12 HOURS AS NEEDED FOR MODERATE PAIN IN LOWER BACK 60 tablet 2 Past Week at Unknown time  . traZODone (DESYREL) 150 MG tablet TAKE 1 TABLET(150 MG) BY MOUTH AT BEDTIME AS NEEDED FOR SLEEP 30 tablet 0 09/06/2016   Musculoskeletal: Strength & Muscle Tone: within normal limits Gait & Station: normal Patient leans: N/A  Psychiatric Specialty Exam: Physical Exam  Constitutional: He is oriented to person, place, and time. He appears well-developed.  HENT:  Head: Normocephalic.  Eyes: Pupils are equal, round, and reactive to light.  Neck: Normal range of motion.  Cardiovascular: Normal rate.   Respiratory: Effort normal.  GI: Soft.  Genitourinary:  Genitourinary Comments: Deferred  Musculoskeletal:  Normal range of motion.  Neurological: He is alert and oriented to person, place, and time.  Skin: Skin is warm and dry.    Review of Systems  Constitutional: Positive for chills, diaphoresis and malaise/fatigue.  HENT: Negative.   Eyes: Positive for blurred vision.  Respiratory: Negative.   Cardiovascular: Negative.   Gastrointestinal: Positive for abdominal pain and nausea.  Genitourinary: Negative.   Musculoskeletal: Positive for joint pain and myalgias.  Skin: Negative.   Neurological: Positive for dizziness.  Endo/Heme/Allergies: Negative.   Psychiatric/Behavioral: Positive for depression and substance abuse (opioid use disorder). Negative for hallucinations and memory loss. The patient is nervous/anxious and has insomnia.  Blood pressure 110/74, pulse 86, temperature 97.9 F (36.6 C), temperature source Oral, resp. rate 16, height 5' 9.29" (1.76 m), weight 80.7 kg (177 lb 14.4 oz).Body mass index is 26.05 kg/m.  General Appearance: Disheveled, restless.  Eye Contact:  Fair  Speech:  Clear and Coherent, not spontaneous.  Volume:  Decreased  Mood:  Anxious, Depressed and Hopeless  Affect:  Constricted  Thought Process:  Coherent  Orientation:  Full (Time, Place, and Person)  Thought Content:  Ruminations, however, denies any hallucinations, delusional thoughts or paranoia.  Suicidal Thoughts:  "Not right now"  Homicidal Thoughts:  Denies any thoughts, plans or intent.  Memory:  Immediate;   Good Recent;   Good Remote;   Good  Judgement:  Fair  Insight:  Present  Psychomotor Activity:  Restlessness  Concentration:  Concentration: Fair and Attention Span: Fair  Recall:  Good  Fund of Knowledge:  Fair  Language:  Good  Akathisia:  Negative  Handed:  Right  AIMS (if indicated):     Assets:  Communication Skills Desire for Improvement Physical Health  ADL's:  Intact  Cognition:  WNL  Sleep:  Number of Hours: 5.75   Treatment Plan/Recommendations: 1. Admit for  crisis management and stabilization, estimated length of stay 3-5 days.  2. Medication management to reduce current symptoms to base line and improve the patient's overall level of functioning: Will initiate opioid detox protocols & move patient from 400-Hall to the 300-Hall unit.  3. Treat health problems as indicated.  4. Develop treatment plan to decrease risk of relapse upon discharge and the need for readmission.  5. Psycho-social education regarding relapse prevention and self care.  6. Health care follow up as needed for medical problems.  7. Review, reconcile, and reinstate any pertinent home medications for other health issues where appropriate. 8. Call for consults with hospitalist for any additional specialty patient care services as needed.  Observation Level/Precautions:  15 minute checks  Laboratory:  Per ED  Psychotherapy: Group sessions, AA/NA meetings.  Medications: See [facility]  Consultations: As needed.  Discharge Concerns: Safety, mood stability & maintaining sobriety.  Estimated LOS: 3-5 days  Other: Admit to the 300-Hall.    Physician Treatment Plan for Primary Diagnosis: Opioid use disorder, severe, dependence.  Long Term Goal(s): Improvement in symptoms so as ready for discharge  Short Term Goals: Ability to identify changes in lifestyle to reduce recurrence of condition will improve, Ability to verbalize feelings will improve, Ability to disclose and discuss suicidal ideas, Ability to demonstrate self-control will improve and Ability to identify and develop effective coping behaviors will improve  Physician Treatment Plan for Secondary Diagnosis: Active Problems:   Major depressive disorder without psychotic features  Long Term Goal(s): Improvement in symptoms so as ready for discharge  Short Term Goals: Ability to verbalize feelings will improve, Ability to disclose and discuss suicidal ideas, Ability to demonstrate self-control will improve, Ability to identify and  develop effective coping behaviors will improve, Compliance with prescribed medications will improve and Ability to identify triggers associated with substance abuse/mental health issues will improve  I certify that inpatient services furnished can reasonably be expected to improve the patient's condition.    Encarnacion Slates, NP, PMHNP, FNP-BC 1/5/201811:19 AM   Patient case discussed with NP and patient seen by me  Agree with NP note and assessment  61 year old male, reports history of opiate dependence, has been using heroin ( insufflated- denies IVDA). Reports worsening depression, significant subjective sense of guilt  associated with drug abuse, and recent suicidal ideations, with thoughts of walking into traffic. At this time presents depressed, sad, but denies any active suicidal ideations and expresses motivation in treatment and in working on recovery. (+) symptoms of opiate withdrawal- reports feeling hot and cold, rhinorrhea, aches, nausea, but no vomiting . Vitals are stable. Dx- Opiate Dependence, Opiate Induced Mood Disorder Plan- Inpatient admission , Clonidine detox protocol, continue Cymbalta and Buspar trials- denies any side effects thus far .

## 2016-09-08 NOTE — Progress Notes (Signed)
Pt transferred from 400 hallway to 300 hallway per MD order. This nurse oriented pt to new hallway and new room assignment; room 307. Pt tolerated well. Will continue to monitor on special checks q 15 mins for safety.

## 2016-09-08 NOTE — Progress Notes (Signed)
DAR NOTE.  Pt behavior cooperative. Pt denies SI/HI/AVH. Per pt self inventory form pt reports he slept fair last night with the use of sleep medication. Pt reports a poor appetite, low energy level, poor concentration. Pt rates depression 8/10, hopelessness 7/10, anxiety 5/10- all on 0-10 scale, 10 being the worse. Pt reports withdrawal symptoms including Cravings, runny nose, chilling, nausea, irritability. This nurse notified Dr Jama Flavorsobos of pt active withdrawal; awaiting orders. Pt reports his goal is "feeling better" Encouragement and support provided. Special checks q 15 mins in place for safety. Will continue to monitor.
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
## 2016-09-08 NOTE — Progress Notes (Signed)
Recreation Therapy Notes  Date: 09/08/16 Time: 0930 Location: 300 Hall Dayroom  Group Topic: Stress Management  Goal Area(s) Addresses:  Patient will verbalize importance of using healthy stress management.  Patient will identify positive emotions associated with healthy stress management.   Intervention:  Stress Management  Activity :  [facility] Visualization.  LRT introduced the stress management technique of guided imagery to patients.  LRT read a script to guide patients through the technique so they could engage in the process.  Patients were to follow along as LRT read script.  Education:  Stress Management, Discharge Planning.   Education Outcome: Acknowledges edcuation/In group clarification offered/Needs additional education  Clinical Observations/Feedback:  Pt did not attend group.     Caroll RancherMarjette Zyairah Wacha, LRT/CTRS         Caroll RancherLindsay, Estalene Bergey A 09/08/2016 11:24 AM

## 2016-09-08 NOTE — Progress Notes (Addendum)
EKG completed, pt tolerated well. EKG given to Laurie,NP for review- not concerned at this time; no new orders. Placed on front of pt chart.

## 2016-09-09 DIAGNOSIS — Z9889 Other specified postprocedural states: Secondary | ICD-10-CM

## 2016-09-09 DIAGNOSIS — F332 Major depressive disorder, recurrent severe without psychotic features: Secondary | ICD-10-CM

## 2016-09-09 DIAGNOSIS — Z808 Family history of malignant neoplasm of other organs or systems: Secondary | ICD-10-CM

## 2016-09-09 DIAGNOSIS — F1124 Opioid dependence with opioid-induced mood disorder: Principal | ICD-10-CM

## 2016-09-09 DIAGNOSIS — Z79899 Other long term (current) drug therapy: Secondary | ICD-10-CM

## 2016-09-09 MED ORDER — TRAZODONE HCL 150 MG PO TABS
150.0000 mg | ORAL_TABLET | Freq: Every evening | ORAL | Status: DC | PRN
  Administered 2016-09-09 – 2016-09-11 (×3): 150 mg via ORAL
  Filled 2016-09-09: qty 21
  Filled 2016-09-09 (×3): qty 1

## 2016-09-09 NOTE — Progress Notes (Signed)
[facility] MD Progress Note  09/09/2016 4:35 PM Marvin Clark  MRN:  [id number]  Subjective: Marvin Clark reports, "I still having bad anxiety & sleep problems. I was doing okay until I relapsed on cocaine. I'm hoping to get into [facility] after discharge because the last time that I went into a Rehab program for alcoholism, I stayed off of alcohol for a long time".  Objective:  Marvin Clark is seen, chart reviewed. He is alert, oriented x 4. He is visible on the unit, participating in the group milieu. He is compliant with his medications & currently denies any adverse effects. He is not disruptive on the unit. He is goal oriented hoping to get into [facility] after discharge. He currently denies any SIHI, AVH, delusional thoughts or paranoia.   Principal Problem: Opioid dependence with opioid-induced mood disorder ([place])  Diagnosis:   Patient Active Problem List   Diagnosis Date Noted  . Opioid dependence with opioid-induced mood disorder ([place]) [F11.24]   . 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 BPH (benign prostatic hyperplasia) [N40.0] 09/07/2014  . History of alcohol dependence ([place]) [F10.21] 02/01/2011  . CERVICAL MUSCLE STRAIN [S13.9XXA] 08/23/2010  . High cholesterol [E78.00] 07/08/2007  . Depression [F32.9] 07/08/2007  . EXTERNAL HEMORRHOIDS [K64.4] 07/08/2007  . DIVERTICULOSIS, COLON [K57.30] 07/08/2007  . DISC DISEASE, LUMBAR [M51.37] 07/08/2007   Total Time spent with patient: 25 minutes  Past Psychiatric History: MDD, Opioid dependence, Cocaine dependence  Past Medical History:  Past Medical History:  Diagnosis Date  . Depression 2013   . Diverticulosis of colon   . Hyperlipidemia 2013   . Low back pain 1997    had pain x 6 weeks, then had surgery, did well for 6 years. had L sided sciatica    Past Surgical History:  Procedure Laterality Date  . LUMBAR DISC SURGERY  1997    Family History:  Family History  Problem Relation Age of Onset  . COPD  Mother   . Cancer Father    Family Psychiatric  History: See H&P Social History:  History  Alcohol Use No     History  Drug Use No    Social History   Social History  . Marital status: Divorced    Spouse name: N/A  . Number of children: N/A  . Years of education: N/A   Social History Main Topics  . Smoking status: Never Smoker  . Smokeless tobacco: Never Used  . Alcohol use No  . Drug use: No  . Sexual activity: Yes   Other Topics Concern  . None   Social History Narrative  . None   Additional Social History:    Pain Medications: denies Prescriptions: denies Over the Counter: denies History of alcohol / drug use?: Yes Longest period of sobriety (when/how long): years Negative Consequences of Use: Legal, Financial Withdrawal Symptoms:  (none noted) Name of Substance 1: heroin 1 - Age of First Use: unk 1 - Amount (size/oz): 1/2 gram  1 - Frequency: daily 1 - Duration: 8 mo 1 - Last Use / Amount: yesterday Name of Substance 2: cocaine 2 - Age of First Use: unk 2 - Amount (size/oz): 12/gram  2 - Frequency: daily 2 - Duration: 8 mo 2 - Last Use / Amount: yesterday  Sleep: Poor  Appetite:  Fair  Current Medications: Current Facility-Administered Medications  Medication Dose Route Frequency Provider Last Rate Last Dose  . acetaminophen (TYLENOL) tablet 650 mg  650 mg Oral Q6H PRN Rozetta Nunnery, NP  650 mg at 09/09/16 1513  . alum & mag hydroxide-simeth (MAALOX/MYLANTA) 200-200-20 MG/5ML suspension 30 mL  30 mL Oral Q4H PRN Rozetta Nunnery, NP      . busPIRone (BUSPAR) tablet 7.5 mg  7.5 mg Oral BID Rozetta Nunnery, NP   7.5 mg at 09/09/16 0855  . cloNIDine (CATAPRES) tablet 0.1 mg  0.1 mg Oral QID Encarnacion Slates, NP   0.1 mg at 09/09/16 0854   Followed by  . [START ON 09/10/2016] cloNIDine (CATAPRES) tablet 0.1 mg  0.1 mg Oral BH-qamhs Encarnacion Slates, NP       Followed by  . [START ON 09/12/2016] cloNIDine (CATAPRES) tablet 0.1 mg  0.1 mg Oral QAC breakfast Encarnacion Slates, NP      . dicyclomine (BENTYL) tablet 20 mg  20 mg Oral Q6H PRN Encarnacion Slates, NP      . DULoxetine (CYMBALTA) DR capsule 60 mg  60 mg Oral Daily Rozetta Nunnery, NP   60 mg at 09/09/16 0854  . hydrOXYzine (ATARAX/VISTARIL) tablet 25 mg  25 mg Oral Q6H PRN Encarnacion Slates, NP   25 mg at 09/08/16 2254  . loperamide (IMODIUM) capsule 2-4 mg  2-4 mg Oral PRN Encarnacion Slates, NP      . magnesium hydroxide (MILK OF MAGNESIA) suspension 30 mL  30 mL Oral Daily PRN Rozetta Nunnery, NP      . methocarbamol (ROBAXIN) tablet 500 mg  500 mg Oral Q8H PRN Encarnacion Slates, NP      . naproxen (NAPROSYN) tablet 500 mg  500 mg Oral BID PRN Encarnacion Slates, NP      . ondansetron (ZOFRAN-ODT) disintegrating tablet 4 mg  4 mg Oral Q6H PRN Encarnacion Slates, NP   4 mg at 09/09/16 1512  . pravastatin (PRAVACHOL) tablet 40 mg  40 mg Oral Daily Rozetta Nunnery, NP   40 mg at 09/09/16 0855  . traZODone (DESYREL) tablet 100 mg  100 mg Oral QHS PRN Jenne Campus, MD   100 mg at 09/08/16 2254    Lab Results:  Results for orders placed or performed during the hospital encounter of 09/07/16 (from the past 48 hour(s))  Urine rapid drug screen (hosp performed)not at [facility]     Status: None   Collection Time: 09/08/16  6:15 AM  Result Value Ref Range   Opiates NONE DETECTED NONE DETECTED   Cocaine NONE DETECTED NONE DETECTED   Benzodiazepines NONE DETECTED NONE DETECTED   Amphetamines NONE DETECTED NONE DETECTED   Tetrahydrocannabinol NONE DETECTED NONE DETECTED   Barbiturates NONE DETECTED NONE DETECTED    Comment:        DRUG SCREEN FOR MEDICAL PURPOSES ONLY.  IF CONFIRMATION IS NEEDED FOR ANY PURPOSE, NOTIFY LAB WITHIN 5 DAYS.        LOWEST DETECTABLE LIMITS FOR URINE DRUG SCREEN Drug Class       Cutoff (ng/mL) Amphetamine      1000 Barbiturate      200 Benzodiazepine   124 Tricyclics       580 Opiates          300 Cocaine          300 THC              50 Performed at [facility]   CBC      Status: None   Collection Time: 09/08/16  6:32 AM  Result Value Ref Range  WBC 8.0 4.0 - 10.5 K/uL   RBC 5.27 4.22 - 5.81 MIL/uL   Hemoglobin 15.3 13.0 - 17.0 g/dL   HCT 45.1 39.0 - 52.0 %   MCV 85.6 78.0 - 100.0 fL   MCH 29.0 26.0 - 34.0 pg   MCHC 33.9 30.0 - 36.0 g/dL   RDW 12.3 11.5 - 15.5 %   Platelets 269 150 - 400 K/uL    Comment: Performed at [facility]  Comprehensive metabolic panel     Status: Abnormal   Collection Time: 09/08/16  6:32 AM  Result Value Ref Range   Sodium 142 135 - 145 mmol/L   Potassium 4.1 3.5 - 5.1 mmol/L   Chloride 105 101 - 111 mmol/L   CO2 28 22 - 32 mmol/L   Glucose, Bld 140 (H) 65 - 99 mg/dL   BUN 10 6 - 20 mg/dL   Creatinine, Ser 0.87 0.61 - 1.24 mg/dL   Calcium 9.7 8.9 - 10.3 mg/dL   Total Protein 6.8 6.5 - 8.1 g/dL   Albumin 4.3 3.5 - 5.0 g/dL   AST 20 15 - 41 U/L   ALT 17 17 - 63 U/L   Alkaline Phosphatase 65 38 - 126 U/L   Total Bilirubin 0.4 0.3 - 1.2 mg/dL   GFR calc non Af Amer >60 >60 mL/min   GFR calc Af Amer >60 >60 mL/min    Comment: (NOTE) The eGFR has been calculated using the CKD EPI equation. This calculation has not been validated in all clinical situations. eGFR's persistently <60 mL/min signify possible Chronic Kidney Disease.    Anion gap 9 5 - 15    Comment: Performed at [facility]  Lipid panel     Status: None   Collection Time: 09/08/16  6:32 AM  Result Value Ref Range   Cholesterol 182 0 - 200 mg/dL   Triglycerides 124 <150 mg/dL   HDL 58 >40 mg/dL   Total CHOL/HDL Ratio 3.1 RATIO   VLDL 25 0 - 40 mg/dL   LDL Cholesterol 99 0 - 99 mg/dL    Comment:        Total Cholesterol/HDL:CHD Risk Coronary Heart Disease Risk Table                     Men   Women  1/2 Average Risk   3.4   3.3  Average Risk       5.0   4.4  2 X Average Risk   9.6   7.1  3 X Average Risk  23.4   11.0        Use the calculated Patient Ratio above and the CHD Risk Table to determine the  patient's CHD Risk.        ATP III CLASSIFICATION (LDL):  <100     mg/dL   Optimal  100-129  mg/dL   Near or Above                    Optimal  130-159  mg/dL   Borderline  160-189  mg/dL   High  >190     mg/dL   Very High Performed at [facility]     Blood Alcohol level:  No results found for: [facility]  Metabolic Disorder Labs: Lab Results  Component Value Date   HGBA1C 5.30 09/23/2015   MPG 111 08/19/2013   No results found for: PROLACTIN Lab Results  Component Value Date   CHOL  182 09/08/2016   TRIG 124 09/08/2016   HDL 58 09/08/2016   CHOLHDL 3.1 09/08/2016   VLDL 25 09/08/2016   LDLCALC 99 09/08/2016   LDLCALC 99 09/17/2014    Physical Findings: AIMS: Facial and Oral Movements Muscles of Facial Expression: None, normal Lips and Perioral Area: None, normal Jaw: None, normal Tongue: None, normal,Extremity Movements Upper (arms, wrists, hands, fingers): None, normal Lower (legs, knees, ankles, toes): None, normal, Trunk Movements Neck, shoulders, hips: None, normal, Overall Severity Severity of abnormal movements (highest score from questions above): None, normal Incapacitation due to abnormal movements: None, normal Patient's awareness of abnormal movements (rate only patient's report): No Awareness, Dental Status Current problems with teeth and/or dentures?: No Does patient usually wear dentures?: No  CIWA:    COWS:  COWS Total Score: 0  Musculoskeletal: Strength & Muscle Tone: within normal limits Gait & Station: normal Patient leans: N/A  Psychiatric Specialty Exam: Physical Exam  ROS  Blood pressure (!) 97/53, pulse 62, temperature 98.4 F (36.9 C), resp. rate 18, height 5' 9.29" (1.76 m), weight 80.7 kg (177 lb 14.4 oz).Body mass index is 26.05 kg/m.  General Appearance: Fairly Groomed  Eye Contact:  Fair  Speech:  Normal Rate  Volume:  Decreased  Mood:  Depressed and Dysphoric  Affect:  Constricted  Thought Process:  Linear  Orientation:   Other:  fully alert and attentive   Thought Content:  linear   Suicidal Thoughts:  No at this time denies suicidal plan or intention and is able to contract for safety on the unit  Homicidal Thoughts:  No denies homicidal or violent ideations  Memory:  recent and remote grossly intact   Judgement:  Other:  improving   Insight:  Present  Psychomotor Activity:  slight restlessness, no tremors  Concentration:  Concentration: Good and Attention Span: Good  Recall:  Good  Fund of Knowledge:  Good  Language:  Good  Akathisia:  Negative  Handed:  Right  AIMS (if indicated):     Assets:  Desire for Improvement Resilience  ADL's:  Fair   Cognition:  WNL  Sleep:  Number of Hours: 6.5     Treatment Plan Summary:  Daily contact with patient to assess and evaluate symptoms and progress in treatment and Medication management  Reviewed past medical records & treatment plan.   For Opioid withdrawal symptoms/detox: Will continue Clonidine detox protocols.  For Depressed mood/anxiety: Will continue Cymbalta 60 mg mg po daily.  For Anxiety disorder: Will continue Buspar 7.5 mg po Bid.  For insomnia: Will increase Trazodone to 150 mg  mg po qhs routinely.  Other medical issues & concerns: Will continue home medications for those health issues as recommended.  - Continue 15 minutes observation for safety concerns - Encouraged to participate in milieu therapy and group therapy counseling sessions and also work with coping skills -  Develop treatment plan to reduce the need for readmission. -  Psycho-social education regarding self care. - Health care follow up as needed for medical problems. - Restart home medications where appropriate.  Encarnacion Slates, NP, PMHNP, FNP-BC 09/09/2016, 4:35 PM  Agree with notes and plan

## 2016-09-09 NOTE — [facility] Group Notes (Signed)
  [facility] LCSW Group Therapy Note  Date and  10:00 to 11:10 AM  Type of Therapy and Topic:  Group Therapy: Avoiding Self-Sabotaging and Enabling Behaviors  Participation Level:  Did Not Attend; invited to participate yet did not despite overhead announcement and encouragement by staff   Seger Jani C Kaito Schulenburg, LCSW 

## 2016-09-09 NOTE — Progress Notes (Signed)
D: Pt at the time of assessment was alert and oriented x4. Pt endorsed moderate anxiety and depression; states, "I feel better than I was earlier but I know I still got ways to go" Pt denied pain, SI, HI or AVH. Pt was observed having meaningful conversations with peers. Pt was unable to seat still.  A: Medications offered as prescribed.  Support, encouragement, and safe environment provided.  15-minute safety checks continue. R: Pt was med compliant. Safety checks continue

## 2016-09-09 NOTE — Progress Notes (Signed)
D: Patient's self inventory sheet: patient has poor sleep, recieved sleep medication.fair  Appetite, low energy level, poor concentration. Rated depression 7/10, hopeless 6/10, anxiety 7/10. SI/HI/AVH: denies all. Physical complaints are chilling, cravings, agitation, nausea and irritability, as well as lightheadedness, dizziness and headaches. Goal is "feeling better". Plans to work on "try to stay out of bed".   A: Medications administered, assessed medication knowledge and education given on medication regimen.  Emotional support and encouragement given patient. R: Denies SI and HI , contracts for safety. Safety maintained with 15 minute checks.

## 2016-09-09 NOTE — [facility] Group Notes (Signed)
Nurse PsychoEducational Group and Goals review: Patient attended and actively participated, stating that he feels better with decreased sweats, anxiety and sleep.

## 2016-09-09 NOTE — Progress Notes (Signed)
The patient attended this evening's A. A. Meeting and was appropriate.  

## 2016-09-10 DIAGNOSIS — F332 Major depressive disorder, recurrent severe without psychotic features: Secondary | ICD-10-CM

## 2016-09-10 NOTE — Progress Notes (Signed)
The patient attended this evening's A. A. Meeting and was appropriate.  

## 2016-09-10 NOTE — [facility] Group Notes (Signed)
[facility] LCSW Group Therapy  09/10/2016 10 - 10:45 AM  Type of Therapy:  Group Therapy  Participation Level:  Did Not Attend; invited to participate yet did not despite overhead announcement and encouragement by staff   Carney Bernatherine C Lanyla Costello, LCSW

## 2016-09-10 NOTE — [facility] Group Notes (Signed)
[facility] Group Notes:  (Nursing/MHT/Case Management/Adjunct)  Date:  09/10/2016  Time:  4:36 PM  Type of Therapy:  Nurse Education  Participation Level:  Active  Participation Quality:  Attentive  Affect:  appropriate  Cognitive:  Alert and Oriented  Insight:  Good  Engagement in Group:  Engaged  Modes of Intervention:  Discussion and Education  Summary of Progress/Problems: very engaged and active in group, talked about how helpful AA has been for him in the past. He defined recovery as getting his self respect back and being a good father.   Vinetta BergamoBarbara M Thresia Ramanathan 09/10/2016, 4:36 PM

## 2016-09-10 NOTE — Progress Notes (Signed)
D: Pt at the time of assessment was alert and oriented x4. Pt endorsed moderate anxiety and depression; states, "I'm getting there, still not there yet but I am getting there" Pt denied pain, SI, HI or AVH. Was observed having meaningful conversations with peers. Pt was unable to seat still.  A: Medications offered as prescribed.  Support, encouragement, and safe environment provided.  15-minute safety checks continue. R: Pt was med compliant.  Pt attended AA group. Safety checks continue.

## 2016-09-10 NOTE — Progress Notes (Signed)
D: Patient's self inventory sheet: patient has poor sleep, recieved sleep medication.fair  Appetite, low energy level, poor concentration. Rated depression 6/10, hopeless 5/10, anxiety 7/10. SI/HI/AVH: Denies all. Physical complaints are back pain which is chronic. Goal is "feeling better". Plans to work on "anything".   A: Medications administered, assessed medication knowledge and education given on medication regimen.  Emotional support and encouragement given patient. R: Denies SI and HI , contracts for safety. Safety maintained with 15 minute checks.

## 2016-09-10 NOTE — Progress Notes (Signed)
[facility]  09/10/2016 4:25 PM Marvin Clark  MRN:  [id number]  Subjective: Marvin Clark reports, "I'm slowly getting there. The withdrawal symptoms are better today. I'm kind of worried about something. I will need the SW sent a letter to the child support office telling them I'm in the hospital. My mood is still down a little, I guess I can expect that for what has transcended within the last few days. I slept fairly last night, but I'm getting there".   Objective:  Marvin Clark is seen, chart reviewed. He is alert, oriented x 4. He is visible on the unit, participating in the group milieu. He is compliant with his medications & currently denies any adverse effects. He is not disruptive on the unit. He is goal oriented hoping to get into [facility] after discharge. He currently denies any SIHI, AVH, delusional thoughts or paranoia. He has asked for the Social Worker to send a letter to the Child support office alerting them that he is currently in the hospital. He denies any SIHI, AVH, delusional thoughts or paranoia.  Principal Problem: Opioid dependence with opioid-induced mood disorder (HCC)  Diagnosis:   Patient Active Problem List   Diagnosis Date Noted  . Opioid dependence with opioid-induced mood disorder (HCC) [F11.24]   . 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 BPH (benign prostatic hyperplasia) [N40.0] 09/07/2014  . History of alcohol dependence (HCC) [F10.21] 02/01/2011  . CERVICAL MUSCLE STRAIN [S13.9XXA] 08/23/2010  . High cholesterol [E78.00] 07/08/2007  . Depression [F32.9] 07/08/2007  . EXTERNAL HEMORRHOIDS [K64.4] 07/08/2007  . DIVERTICULOSIS, COLON [K57.30] 07/08/2007  . DISC DISEASE, LUMBAR [M51.37] 07/08/2007   Total Time spent with patient: 25 minutes  Past Psychiatric History: MDD, Opioid dependence, Cocaine dependence  Past Medical History:  Past Medical History:  Diagnosis Date  . Depression 2013   . Diverticulosis of colon    . Hyperlipidemia 2013   . Low back pain 1997    had pain x 6 weeks, then had surgery, did well for 6 years. had L sided sciatica    Past Surgical History:  Procedure Laterality Date  . LUMBAR DISC SURGERY  1997    Family History:  Family History  Problem Relation Age of Onset  . COPD Mother   . Cancer Father    Family Psychiatric  History: See H&P Social History:  History  Alcohol Use No     History  Drug Use No    Social History   Social History  . Marital status: Divorced    Spouse name: N/A  . Number of children: N/A  . Years of education: N/A   Social History Main Topics  . Smoking status: Never Smoker  . Smokeless tobacco: Never Used  . Alcohol use No  . Drug use: No  . Sexual activity: Yes   Other Topics Concern  . None   Social History Narrative  . None   Additional Social History:    Pain Medications: denies Prescriptions: denies Over the Counter: denies History of alcohol / drug use?: Yes Longest period of sobriety (when/how long): years Negative Consequences of Use: Legal, Financial Withdrawal Symptoms:  (none noted) Name of Substance 1: heroin 1 - Age of First Use: unk 1 - Amount (size/oz): 1/2 gram  1 - Frequency: daily 1 - Duration: 8 mo 1 - Last Use / Amount: yesterday Name of Substance 2: cocaine 2 - Age of First Use: unk 2 - Amount (size/oz): 12/gram  2 - Frequency:  daily 2 - Duration: 8 mo 2 - Last Use / Amount: yesterday  Sleep: Poor  Appetite:  Fair  Current Medications: Current Facility-Administered Medications  Medication Dose Route Frequency Provider Last Rate Last Dose  . acetaminophen (TYLENOL) tablet 650 mg  650 mg Oral Q6H PRN Jackelyn Poling, NP   650 mg at 09/09/16 1513  . alum & mag hydroxide-simeth (MAALOX/MYLANTA) 200-200-20 MG/5ML suspension 30 mL  30 mL Oral Q4H PRN Jackelyn Poling, NP      . busPIRone (BUSPAR) tablet 7.5 mg  7.5 mg Oral BID Jackelyn Poling, NP   7.5 mg at 09/10/16 0748  . cloNIDine (CATAPRES)  tablet 0.1 mg  0.1 mg Oral BH-qamhs Sanjuana Kava, NP   0.1 mg at 09/10/16 0752   Followed by  . [START ON 09/12/2016] cloNIDine (CATAPRES) tablet 0.1 mg  0.1 mg Oral QAC breakfast Sanjuana Kava, NP      . dicyclomine (BENTYL) tablet 20 mg  20 mg Oral Q6H PRN Sanjuana Kava, NP      . DULoxetine (CYMBALTA) DR capsule 60 mg  60 mg Oral Daily Jackelyn Poling, NP   60 mg at 09/10/16 0748  . hydrOXYzine (ATARAX/VISTARIL) tablet 25 mg  25 mg Oral Q6H PRN Sanjuana Kava, NP   25 mg at 09/08/16 2254  . loperamide (IMODIUM) capsule 2-4 mg  2-4 mg Oral PRN Sanjuana Kava, NP      . magnesium hydroxide (MILK OF MAGNESIA) suspension 30 mL  30 mL Oral Daily PRN Jackelyn Poling, NP      . methocarbamol (ROBAXIN) tablet 500 mg  500 mg Oral Q8H PRN Sanjuana Kava, NP   500 mg at 09/10/16 0750  . naproxen (NAPROSYN) tablet 500 mg  500 mg Oral BID PRN Sanjuana Kava, NP   500 mg at 09/10/16 0750  . ondansetron (ZOFRAN-ODT) disintegrating tablet 4 mg  4 mg Oral Q6H PRN Sanjuana Kava, NP   4 mg at 09/09/16 1512  . pravastatin (PRAVACHOL) tablet 40 mg  40 mg Oral Daily Jackelyn Poling, NP   40 mg at 09/10/16 0749  . traZODone (DESYREL) tablet 150 mg  150 mg Oral QHS PRN Sanjuana Kava, NP   150 mg at 09/09/16 2231    Lab Results:  No results found for this or any previous visit (from the past 48 hour(s)).  Blood Alcohol level:  No results found for: [facility]  Metabolic Disorder Labs: Lab Results  Component Value Date   HGBA1C 5.30 09/23/2015   MPG 111 08/19/2013   No results found for: PROLACTIN Lab Results  Component Value Date   CHOL 182 09/08/2016   TRIG 124 09/08/2016   HDL 58 09/08/2016   CHOLHDL 3.1 09/08/2016   VLDL 25 09/08/2016   LDLCALC 99 09/08/2016   LDLCALC 99 09/17/2014    Physical Findings: AIMS: Facial and Oral Movements Muscles of Facial Expression: None, normal Lips and Perioral Area: None, normal Jaw: None, normal Tongue: None, normal,Extremity Movements Upper (arms, wrists, hands, fingers):  None, normal Lower (legs, knees, ankles, toes): None, normal, Trunk Movements Neck, shoulders, hips: None, normal, Overall Severity Severity of abnormal movements (highest score from questions above): None, normal Incapacitation due to abnormal movements: None, normal Patient's awareness of abnormal movements (rate only patient's report): No Awareness, Dental Status Current problems with teeth and/or dentures?: No Does patient usually wear dentures?: No  CIWA:    COWS:  COWS Total Score: 1  Musculoskeletal: Strength & Muscle Tone: within normal limits Gait & Station: normal Patient leans: N/A  Psychiatric Specialty Exam: Physical Exam  ROS  Blood pressure 109/70, pulse 62, temperature 98.4 F (36.9 C), resp. rate 16, height 5' 9.29" (1.76 m), weight 80.7 kg (177 lb 14.4 oz).Body mass index is 26.05 kg/m.  General Appearance: Fairly Groomed  Eye Contact:  Fair  Speech:  Normal Rate  Volume:  Decreased  Mood:  Depressed and Dysphoric  Affect:  Constricted  Thought Process:  Linear  Orientation:  Other:  fully alert and attentive   Thought Content:  linear   Suicidal Thoughts:  No at this time denies suicidal plan or intention and is able to contract for safety on the unit  Homicidal Thoughts:  No denies homicidal or violent ideations  Memory:  recent and remote grossly intact   Judgement:  Other:  improving   Insight:  Present  Psychomotor Activity:  slight restlessness, no tremors  Concentration:  Concentration: Good and Attention Span: Good  Recall:  Good  Fund of Knowledge:  Good  Language:  Good  Akathisia:  Negative  Handed:  Right  AIMS (if indicated):     Assets:  Desire for Improvement Resilience  ADL's:  Fair   Cognition:  WNL  Sleep:  Number of Hours: 6.5     Treatment Plan Summary:  Daily contact with patient to assess and evaluate symptoms and progress in treatment and Medication management  Reviewed past medical records & treatment plan.   For Opioid  withdrawal symptoms/detox: Will continue Clonidine detox protocols.  For Depressed mood/anxiety: Will continue Cymbalta 60 mg mg po daily.  For Anxiety disorder: Will continue Buspar 7.5 mg po Bid.  For insomnia: Will increase Trazodone to 150 mg  mg po qhs routinely.  Other medical issues & concerns: Will continue home medications for those health issues as recommended.  - Continue 15 minutes observation for safety concerns - Encouraged to participate in milieu therapy and group therapy counseling sessions and also work with coping skills -  Develop treatment plan to reduce the need for readmission. -  Psycho-social education regarding self care. - Health care follow up as needed for medical problems. - Restart home medications where appropriate. No changes made on the current plan of care, continue treatment as recommended.  Sanjuana Kava, NP, PMHNP, FNP-BC 09/10/2016, 4:25 PM   I agree to notes and plan.

## 2016-09-11 DIAGNOSIS — Z8489 Family history of other specified conditions: Secondary | ICD-10-CM

## 2016-09-11 MED ORDER — BUSPIRONE HCL 10 MG PO TABS
10.0000 mg | ORAL_TABLET | Freq: Two times a day (BID) | ORAL | Status: DC
  Administered 2016-09-11 – 2016-09-12 (×2): 10 mg via ORAL
  Filled 2016-09-11: qty 2
  Filled 2016-09-11 (×4): qty 1

## 2016-09-11 NOTE — Progress Notes (Signed)
[facility]  09/11/2016 11:57 AM Ronda Fairlyicholas J Footman  MRN:  [id number]  Subjective: Janyth Pupaicholas reports, I don't have withdrawal symptoms  today. I'm kind of worried about my discharge.   Objective:  Janyth Pupaicholas is seen, chart reviewed. He is alert, oriented x 4. He is visible on the unit, participating in the group milieu. He is compliant with his medications & currently denies any adverse effects.   Patient is mostly anxious of discharge  planning    Principal Problem: Opioid dependence with opioid-induced mood disorder (HCC)  Diagnosis:   Patient Active Problem List   Diagnosis Date Noted  . Severe episode of recurrent major depressive disorder, without psychotic features (HCC) [F33.2]   . Opioid dependence with opioid-induced mood disorder (HCC) [F11.24]   . 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 BPH (benign prostatic hyperplasia) [N40.0] 09/07/2014  . History of alcohol dependence (HCC) [F10.21] 02/01/2011  . CERVICAL MUSCLE STRAIN [S13.9XXA] 08/23/2010  . High cholesterol [E78.00] 07/08/2007  . Depression [F32.9] 07/08/2007  . EXTERNAL HEMORRHOIDS [K64.4] 07/08/2007  . DIVERTICULOSIS, COLON [K57.30] 07/08/2007  . DISC DISEASE, LUMBAR [M51.37] 07/08/2007   Total Time spent with patient: 25 minutes  Past Psychiatric History: MDD, Opioid dependence, Cocaine dependence  Past Medical History:  Past Medical History:  Diagnosis Date  . Depression 2013   . Diverticulosis of colon   . Hyperlipidemia 2013   . Low back pain 1997    had pain x 6 weeks, then had surgery, did well for 6 years. had L sided sciatica    Past Surgical History:  Procedure Laterality Date  . LUMBAR DISC SURGERY  1997    Family History:  Family History  Problem Relation Age of Onset  . COPD Mother   . Cancer Father    Family Psychiatric  History: See H&P Social History:  History  Alcohol Use No     History  Drug Use No    Social History   Social History   . Marital status: Divorced    Spouse name: N/A  . Number of children: N/A  . Years of education: N/A   Social History Main Topics  . Smoking status: Never Smoker  . Smokeless tobacco: Never Used  . Alcohol use No  . Drug use: No  . Sexual activity: Yes   Other Topics Concern  . None   Social History Narrative  . None   Additional Social History:    Pain Medications: denies Prescriptions: denies Over the Counter: denies History of alcohol / drug use?: Yes Longest period of sobriety (when/how long): years Negative Consequences of Use: Legal, Financial Withdrawal Symptoms:  (none noted) Name of Substance 1: heroin 1 - Age of First Use: unk 1 - Amount (size/oz): 1/2 gram  1 - Frequency: daily 1 - Duration: 8 mo 1 - Last Use / Amount: yesterday Name of Substance 2: cocaine 2 - Age of First Use: unk 2 - Amount (size/oz): 12/gram  2 - Frequency: daily 2 - Duration: 8 mo 2 - Last Use / Amount: yesterday  Sleep: Poor  Appetite:  Fair  Current Medications: Current Facility-Administered Medications  Medication Dose Route Frequency Provider Last Rate Last Dose  . acetaminophen (TYLENOL) tablet 650 mg  650 mg Oral Q6H PRN Jackelyn PolingJason A Berry, NP   650 mg at 09/10/16 1847  . alum & mag hydroxide-simeth (MAALOX/MYLANTA) 200-200-20 MG/5ML suspension 30 mL  30 mL Oral Q4H PRN Jackelyn PolingJason A Berry, NP      .  busPIRone (BUSPAR) tablet 10 mg  10 mg Oral BID Adonis Brook, NP      . cloNIDine (CATAPRES) tablet 0.1 mg  0.1 mg Oral BH-qamhs Sanjuana Kava, NP   0.1 mg at 09/11/16 0816   Followed by  . [START ON 09/12/2016] cloNIDine (CATAPRES) tablet 0.1 mg  0.1 mg Oral QAC breakfast Sanjuana Kava, NP      . dicyclomine (BENTYL) tablet 20 mg  20 mg Oral Q6H PRN Sanjuana Kava, NP      . DULoxetine (CYMBALTA) DR capsule 60 mg  60 mg Oral Daily Jackelyn Poling, NP   60 mg at 09/11/16 0815  . hydrOXYzine (ATARAX/VISTARIL) tablet 25 mg  25 mg Oral Q6H PRN Sanjuana Kava, NP   25 mg at 09/10/16 1847  .  loperamide (IMODIUM) capsule 2-4 mg  2-4 mg Oral PRN Sanjuana Kava, NP      . magnesium hydroxide (MILK OF MAGNESIA) suspension 30 mL  30 mL Oral Daily PRN Jackelyn Poling, NP      . methocarbamol (ROBAXIN) tablet 500 mg  500 mg Oral Q8H PRN Sanjuana Kava, NP   500 mg at 09/11/16 0915  . naproxen (NAPROSYN) tablet 500 mg  500 mg Oral BID PRN Sanjuana Kava, NP   500 mg at 09/11/16 0914  . ondansetron (ZOFRAN-ODT) disintegrating tablet 4 mg  4 mg Oral Q6H PRN Sanjuana Kava, NP   4 mg at 09/10/16 1652  . pravastatin (PRAVACHOL) tablet 40 mg  40 mg Oral Daily Jackelyn Poling, NP   40 mg at 09/11/16 0815  . traZODone (DESYREL) tablet 150 mg  150 mg Oral QHS PRN Sanjuana Kava, NP   150 mg at 09/10/16 2234    Lab Results:  No results found for this or any previous visit (from the past 48 hour(s)).  Blood Alcohol level:  No results found for: [facility]  Metabolic Disorder Labs: Lab Results  Component Value Date   HGBA1C 5.30 09/23/2015   MPG 111 08/19/2013   No results found for: PROLACTIN Lab Results  Component Value Date   CHOL 182 09/08/2016   TRIG 124 09/08/2016   HDL 58 09/08/2016   CHOLHDL 3.1 09/08/2016   VLDL 25 09/08/2016   LDLCALC 99 09/08/2016   LDLCALC 99 09/17/2014    Physical Findings: AIMS: Facial and Oral Movements Muscles of Facial Expression: None, normal Lips and Perioral Area: None, normal Jaw: None, normal Tongue: None, normal,Extremity Movements Upper (arms, wrists, hands, fingers): None, normal Lower (legs, knees, ankles, toes): None, normal, Trunk Movements Neck, shoulders, hips: None, normal, Overall Severity Severity of abnormal movements (highest score from questions above): None, normal Incapacitation due to abnormal movements: None, normal Patient's awareness of abnormal movements (rate only patient's report): No Awareness, Dental Status Current problems with teeth and/or dentures?: No Does patient usually wear dentures?: No  CIWA:    COWS:  COWS Total  Score: 0  Musculoskeletal: Strength & Muscle Tone: within normal limits Gait & Station: normal Patient leans: N/A  Psychiatric Specialty Exam: Physical Exam  ROS  Blood pressure 115/71, pulse (!) 57, temperature 97.7 F (36.5 C), temperature source Oral, resp. rate 16, height 5' 9.29" (1.76 m), weight 80.7 kg (177 lb 14.4 oz).Body mass index is 26.05 kg/m.  General Appearance: Fairly Groomed  Eye Contact:  Fair  Speech:  Normal Rate  Volume:  Decreased  Mood:  Depressed and Dysphoric  Affect:  Constricted  Thought Process:  Linear  Orientation:  Other:  fully alert and attentive   Thought Content:  linear   Suicidal Thoughts:  No at this time denies suicidal plan or intention and is able to contract for safety on the unit  Homicidal Thoughts:  No denies homicidal or violent ideations  Memory:  recent and remote grossly intact   Judgement:  Other:  improving   Insight:  Present  Psychomotor Activity:  slight restlessness, no tremors  Concentration:  Concentration: Good and Attention Span: Good  Recall:  Good  Fund of Knowledge:  Good  Language:  Good  Akathisia:  Negative  Handed:  Right  AIMS (if indicated):     Assets:  Desire for Improvement Resilience  ADL's:  Fair   Cognition:  WNL  Sleep:  Number of Hours: 6.5     Treatment Plan Summary:  Daily contact with patient to assess and evaluate symptoms and progress in treatment and Medication management  Reviewed past medical records & treatment plan.   For Opioid withdrawal symptoms/detox: Will continue Clonidine detox protocols.  For Depressed mood/anxiety: Will continue Cymbalta 60 mg mg po daily.  For Anxiety disorder: Will increase Buspar to 10 mg po Bid.  For insomnia: Will increase Trazodone to 150 mg  mg po qhs routinely.  Other medical issues & concerns: Will continue home medications for those health issues as recommended.  - Continue 15 minutes observation for safety concerns - Encouraged to  participate in milieu therapy and group therapy counseling sessions and also work with coping skills -  Develop treatment plan to reduce the need for readmission. -  Psycho-social education regarding self care. - Health care follow up as needed for medical problems. - Restart home medications where appropriate. No changes made on the current plan of care, continue treatment as recommended.  Velna Hatchet May Agamjot Kilgallon, NP-BC 09/11/2016, 11:57 AM

## 2016-09-11 NOTE — [facility] Group Notes (Signed)
[facility] LCSW Group Therapy  09/11/2016 3:50 PM  Type of Therapy:  Group Therapy  Participation Level:  Active  Participation Quality:  Attentive  Affect:  Appropriate  Cognitive:  Alert and Oriented  Insight:  Improving  Engagement in Therapy:  Improving  Modes of Intervention:  Confrontation, Discussion, Education, Problem-solving, Rapport Building, Socialization and Support  Summary of Progress/Problems: Today's Topic: Overcoming Obstacles. Patients identified one short term goal and potential obstacles in reaching this goal. Patients processed barriers involved in overcoming these obstacles. Patients identified steps necessary for overcoming these obstacles and explored motivation (internal and external) for facing these difficulties head on.   Hodges Treiber N Smart LCSW 09/11/2016, 3:50 PM

## 2016-09-11 NOTE — Progress Notes (Signed)
Recreation Therapy Notes  Date: 09/11/16 Time: 0930 Location: [address] Group Room  Group Topic: Stress Management  Goal Area(s) Addresses:  Patient will verbalize importance of using healthy stress management.  Patient will identify positive emotions associated with healthy stress management.   Behavioral Response: Engaged  Intervention: Stress Management   Activity :  Body Scan Meditation.  LRT introduced the stress management technique of meditation to patients.  LRT played a meditation from the Calm app to allow patients the opportunity to engage in the meditation.  Patients were to follow along as the meditation played.  Education:  Stress Management, Discharge Planning.   Education Outcome: Acknowledges edcuation/In group clarification offered/Needs additional education  Clinical Observations/Feedback: Pt attended group.    Caroll RancherMarjette Macguire Holsinger, LRT/CTRS         Lillia AbedLindsay, Elige Shouse A 09/11/2016 12:12 PM

## 2016-09-11 NOTE — Progress Notes (Signed)
D: Wilian denied SI, HI, and AVH. He contracted for safety as well. He's reported some anxiety today, for which he received Vistaril with some relief. He has been pleasant and cooperative. He reported fair sleep, fair appetite, poor concentration, and low energy level. His goal has been to work on finding a rehab. He reports experiencing some substance cravings. He rates his depression 6/10, hopelessness 5/10, and anxiety 7/10.  A: Meds given as ordered. Q15 safety checks maintained. Support/encouragement offered.  R: Pt remains free from harm and continues with treatment. Will continue to monitor for needs/safety.

## 2016-09-11 NOTE — Progress Notes (Signed)
D: Pt at the time of assessment was alert and oriented x4. Pt endorsed moderate anxiety and depression; states, "I know I should be leaving soon and that is making me somewhat nervous." Pt denied pain, SI, HI or AVH. P was witnesses interacting appropriately wit peers. Pt was calm and cooperative.  A: Medications offered as prescribed.  Support, encouragement, and safe environment provided.  15-minute safety checks continue. R: Pt was med compliant.  Pt attended AA group. Safety checks continue.

## 2016-09-12 MED ORDER — PRAVASTATIN SODIUM 40 MG PO TABS: 40.0000 mg | ORAL_TABLET | Freq: Every day | ORAL | 0 refills | Status: DC

## 2016-09-12 MED ORDER — HYDROXYZINE HCL 25 MG PO TABS: 25.0000 mg | ORAL_TABLET | Freq: Four times a day (QID) | ORAL | 0 refills | Status: DC | PRN

## 2016-09-12 MED ORDER — BUSPIRONE HCL 10 MG PO TABS: 10.0000 mg | ORAL_TABLET | Freq: Two times a day (BID) | ORAL | 0 refills | Status: DC

## 2016-09-12 MED ORDER — TRAZODONE HCL 150 MG PO TABS: 150.0000 mg | ORAL_TABLET | Freq: Every evening | ORAL | 0 refills | Status: DC | PRN

## 2016-09-12 MED ORDER — DULOXETINE HCL 60 MG PO CPEP: 60.0000 mg | ORAL_CAPSULE | Freq: Every day | ORAL | 0 refills | Status: DC

## 2016-09-12 NOTE — Progress Notes (Signed)
Patient ID: Marvin Clark, male   DOB: Nov 14, 1955, 61 y.o.   MRN: [id number]  Patient discharged per MD orders. Patient given education regarding follow-up appointments and medications. Patient denies any questions or concerns about these instructions. Patient was escorted to locker and given belongings before discharge to hospital lobby. Patient currently denies SI/HI and auditory and visual hallucinations on discharge.

## 2016-09-12 NOTE — Discharge Summary (Signed)
Physician Discharge Summary Note  Patient:  Marvin Clark is an 61 y.o., male MRN:  [id number] DOB:  1956/02/19 Patient phone:  [phone] (home)  Patient address:   [address] [place] [address],  Total Time spent with patient: 30 minutes  Date of Admission:  09/07/2016 Date of Discharge: 09/12/2016  Reason for Admission:  Substance abuse  Principal Problem: Opioid dependence with opioid-induced mood disorder [facility]) Discharge Diagnoses: Patient Active Problem List   Diagnosis Date Noted  . Severe episode of recurrent major depressive disorder, without psychotic features (HCC) [F33.2]   . Opioid dependence with opioid-induced mood disorder (HCC) [F11.24]   . 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 BPH (benign prostatic hyperplasia) [N40.0] 09/07/2014  . History of alcohol dependence (HCC) [F10.21] 02/01/2011  . CERVICAL MUSCLE STRAIN [S13.9XXA] 08/23/2010  . High cholesterol [E78.00] 07/08/2007  . Depression [F32.9] 07/08/2007  . EXTERNAL HEMORRHOIDS [K64.4] 07/08/2007  . DIVERTICULOSIS, COLON [K57.30] 07/08/2007  . DISC DISEASE, LUMBAR [M51.37] 07/08/2007    Past Psychiatric History: see HPI  Past Medical History:  Past Medical History:  Diagnosis Date  . Depression 2013   . Diverticulosis of colon   . Hyperlipidemia 2013   . Low back pain 1997    had pain x 6 weeks, then had surgery, did well for 6 years. had L sided sciatica    Past Surgical History:  Procedure Laterality Date  . LUMBAR DISC SURGERY  1997    Family History:  Family History  Problem Relation Age of Onset  . COPD Mother   . Cancer Father    Family Psychiatric  History:  Social History:  History  Alcohol Use No     History  Drug Use No    Social History   Social History  . Marital status: Divorced    Spouse name: N/A  . Number of children: N/A  . Years of education: N/A   Social History Main Topics  . Smoking status: Never Smoker  .  Smokeless tobacco: Never Used  . Alcohol use No  . Drug use: No  . Sexual activity: Yes   Other Topics Concern  . None   Social History Narrative  . None    Hospital Course:   Marvin Clark was admitted for Opioid dependence with opioid-induced mood disorder (HCC) and crisis management.  Patient was treated with medications with their indications listed below in detail under Medication List.  Medical problems were identified and treated as needed.  Home medications were restarted as appropriate.  Improvement was monitored by observation and Marvin Clark daily report of symptom reduction.  Emotional and mental status was monitored by daily self inventory reports completed by Marvin Clark and clinical staff.  Patient reported continued improvement, denied any new concerns.  Patient had been compliant on medications and denied side effects.  Support and encouragement was provided.          Marvin Clark was evaluated by the treatment team for stability and plans for continued recovery upon discharge.  Patient was offered further treatment options upon discharge including Residential, Intensive Outpatient and Outpatient treatment. Patient will follow up with agency listed below for medication management and counseling.  Encouraged patient to maintain satisfactory support network and home environment.  Advised to adhere to medication compliance and outpatient treatment follow up.  Prescriptions provided.       Marvin Clark motivation was an integral factor for scheduling further treatment.  Employment,  transportation, bed availability, health status, family support, and any pending legal issues were also considered during patient's hospital stay.  Upon completion of this admission the patient was both mentally and medically stable for discharge denying suicidal/homicidal ideation, auditory/visual/tactile hallucinations, delusional thoughts and paranoia.      Physical  Findings: AIMS: Facial and Oral Movements Muscles of Facial Expression: None, normal Lips and Perioral Area: None, normal Jaw: None, normal Tongue: None, normal,Extremity Movements Upper (arms, wrists, hands, fingers): None, normal Lower (legs, knees, ankles, toes): None, normal, Trunk Movements Neck, shoulders, hips: None, normal, Overall Severity Severity of abnormal movements (highest score from questions above): None, normal Incapacitation due to abnormal movements: None, normal Patient's awareness of abnormal movements (rate only patient's report): No Awareness, Dental Status Current problems with teeth and/or dentures?: No Does patient usually wear dentures?: No  CIWA:    COWS:  COWS Total Score: 1  Musculoskeletal: Strength & Muscle Tone: within normal limits Gait & Station: normal Patient leans: N/A  Psychiatric Specialty Exam: Physical Exam  Nursing note and vitals reviewed. Psychiatric: He has a normal mood and affect. His speech is normal and behavior is normal. Judgment and thought content normal. Cognition and memory are normal.    Review of Systems  Constitutional: Negative.   HENT: Negative.   Eyes: Negative.   Respiratory: Negative.   Cardiovascular: Negative.   Gastrointestinal: Negative.   Genitourinary: Negative.   Skin: Negative.     Blood pressure 132/68, pulse 78, temperature 97.9 F (36.6 C), temperature source Oral, resp. rate 16, height 5' 9.29" (1.76 m), weight 80.7 kg (177 lb 14.4 oz).Body mass index is 26.05 kg/m.    Have you used any form of tobacco in the last 30 days? (Cigarettes, Smokeless Tobacco, Cigars, and/or Pipes): No  Has this patient used any form of tobacco in the last 30 days? (Cigarettes, Smokeless Tobacco, Cigars, and/or Pipes) Yes, N/A  Blood Alcohol level:  [facility] results found for: [facility]  Metabolic Disorder Labs:  Lab Results  Component Value Date   HGBA1C 5.30 09/23/2015   MPG 111 08/19/2013   No results found for:  PROLACTIN Lab Results  Component Value Date   CHOL 182 09/08/2016   TRIG 124 09/08/2016   HDL 58 09/08/2016   CHOLHDL 3.1 09/08/2016   VLDL 25 09/08/2016   LDLCALC 99 09/08/2016   LDLCALC 99 09/17/2014    See Psychiatric Specialty Exam and Suicide Risk Assessment completed by Attending Physician prior to discharge.  Discharge destination:  Home  Is patient on multiple antipsychotic therapies at discharge:  No   Has Patient had three or more failed trials of antipsychotic monotherapy by history:  No  Recommended Plan for Multiple Antipsychotic Therapies: NA   Allergies as of 09/12/2016   No Known Allergies     Medication List    STOP taking these medications   cyclobenzaprine 5 MG tablet Commonly known as:  FLEXERIL   traMADol 50 MG tablet Commonly known as:  ULTRAM     TAKE these medications     Indication  busPIRone 10 MG tablet Commonly known as:  BUSPAR Take 1 tablet (10 mg total) by mouth 2 (two) times daily. What changed:  See the new instructions.  Indication:  Anxiety Disorder   DULoxetine 60 MG capsule Commonly known as:  CYMBALTA Take 1 capsule (60 mg total) by mouth daily. Start taking on:  09/13/2016  Indication:  Major Depressive Disorder, Musculoskeletal Pain   hydrOXYzine 25 MG tablet Commonly known as:  ATARAX/VISTARIL Take 1 tablet (25 mg total) by mouth every 6 (six) hours as needed for anxiety.  Indication:  Anxiety Neurosis   pravastatin 40 MG tablet Commonly known as:  PRAVACHOL Take 1 tablet (40 mg total) by mouth daily. Start taking on:  09/13/2016  Indication:  High Amount of Fats in the Blood   traZODone 150 MG tablet Commonly known as:  DESYREL Take 1 tablet (150 mg total) by mouth at bedtime as needed for sleep. What changed:  See the new instructions.  Indication:  Trouble Sleeping      Follow-up Information    [facility] Follow up on 09/12/2016.   Why:  You have been accepted for treatment on this date. [facility] transport will pick  you up at 9:30AM. Please make sure that you have 21 day supply of medications. Thank you.  Contact information: [address] [place], [place] [address] Phone: [phone] Fax: [phone]          Follow-up recommendations:  Activity:  as tol Diet:  as tol  Comments:  1.  Take all your medications as prescribed.   2.  Report any adverse side effects to outpatient provider. 3.  Patient instructed to not use alcohol or illegal drugs while on prescription medicines. 4.  In the event of worsening symptoms, instructed patient to call 911, the crisis hotline or go to nearest emergency room for evaluation of symptoms.  Signed: Lindwood Qua, NP [facility] 09/12/2016, 9:34 AM   Patient seen, Suicide Assessment Completed.  Disposition Plan Reviewed

## 2016-09-12 NOTE — [facility] Group Notes (Signed)
[facility] Group Notes:  (Nursing/MHT/Case Management/Adjunct)  Date:  09/12/2016  Time:  9:30 AM  Type of Therapy:  Psychoeducational Skills  Participation Level:  Active  Participation Quality:  Appropriate and Attentive  Affect:  Appropriate  Cognitive:  Alert and Appropriate  Insight:  Appropriate  Engagement in Group:  Developing/Improving  Modes of Intervention:  Support  Summary of Progress/Problems: Patient was attempting to speak to staff, however, too many side conversation were going on at the same time.  MD called patient out of group due to early discharge.  Patient was appropriate and respectful during group.  Cranford MonBeaudry, Annise Boran Evans 09/12/2016, 9:30 AM

## 2016-09-12 NOTE — Progress Notes (Signed)
  [facility] :  Will you be returning to the same living situation after discharge: No-[facility] At discharge, do you have transportation home?: Yes,  [facility] transport is coming at 9:30AM  Do you have the ability to pay for your medications: Yes,  mental health  Release of information consent forms completed and submitted to medical records by CSW.  Patient to Follow up at: Follow-up Information    [facility] Follow up on 09/12/2016.   Why:  You have been accepted for treatment on this date. [facility] transport will pick you up at 9:30AM. Please make sure that you have 21 day supply of medications. Thank you.  Contact information: [address] [place], [place] [address] Phone: [phone] Fax: [phone]          Next level of care provider has [facility] Link:no  Safety Planning and Suicide Prevention discussed: Yes,  SPE completed with pt's wife.  Have you used any form of tobacco in the last 30 days? (Cigarettes, Smokeless Tobacco, Cigars, and/or Pipes): No  Has patient been referred to the Quitline?: N/A patient is not a smoker  Patient has been referred for addiction treatment: Yes  Marvin Clark N Smart LCSW 09/12/2016, 8:59 AM

## 2016-09-12 NOTE — Progress Notes (Signed)
Patient stated in group that he had a good day overall and that he was feeling better. He states that he found out that he will be attending a rehab program following discharge. In terms of the theme for the day, his wellness strategy will be to exercise.

## 2016-09-12 NOTE — Progress Notes (Signed)
Patient ID: Marvin Clark, male   DOB: 1956/05/07, 61 y.o.   MRN: [id number]  Pt currently presents with a blunted affect and anxious behavior. Pt states "It was a better day today." Pt seen in the dayroom, little interaction with peers. Pt reports good sleep with current medication regimen.   Pt provided with medications per providers orders. Pt's labs and vitals were monitored throughout the night. Pt supported emotionally and encouraged to express concerns and questions. Pt educated on medications.  Pt's safety ensured with 15 minute and environmental checks. Pt currently denies SI/HI and A/V hallucinations. Pt verbally agrees to seek staff if SI/HI or A/VH occurs and to consult with staff before acting on any harmful thoughts. Will continue POC.

## 2016-09-12 NOTE — [facility] Suicide Risk Assessment (Signed)
[facility] INPATIENT:  Family/Significant Other Suicide Prevention Education  Suicide Prevention Education:  Education Completed; Marvin BeaverVicki Clark (pt's exwife) [phone] has been identified by the patient as the family member/significant other with whom the patient will be residing, and identified as the person(s) who will aid the patient in the event of a mental health crisis (suicidal ideations/suicide attempt).  With written consent from the patient, the family member/significant other has been provided the following suicide prevention education, prior to the and/or following the discharge of the patient.  The suicide prevention education provided includes the following:  Suicide risk factors  Suicide prevention and interventions  National Suicide Hotline telephone number  [facility] assessment telephone number  [facility] Emergency Assistance 911  [facility] and/or Residential Mobile Crisis Unit telephone number  Request made of family/significant other to:  Remove weapons (e.g., guns, rifles, knives), all items previously/currently identified as safety concern.    Remove drugs/medications (over-the-counter, prescriptions, illicit drugs), all items previously/currently identified as a safety concern.  The family member/significant other verbalizes understanding of the suicide prevention education information provided.  The family member/significant other agrees to remove the items of safety concern listed above.  Marvin Clark N Smart LCSW 09/12/2016, 8:56 AM

## 2016-09-12 NOTE — [facility] Suicide Risk Assessment (Signed)
[facility] Discharge Suicide Risk Assessment   Principal Problem: Opioid dependence with opioid-induced mood disorder [facility]) Discharge Diagnoses:  Patient Active Problem List   Diagnosis Date Noted  . Severe episode of recurrent major depressive disorder, without psychotic features (HCC) [F33.2]   . Opioid dependence with opioid-induced mood disorder (HCC) [F11.24]   . 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 BPH (benign prostatic hyperplasia) [N40.0] 09/07/2014  . History of alcohol dependence (HCC) [F10.21] 02/01/2011  . CERVICAL MUSCLE STRAIN [S13.9XXA] 08/23/2010  . High cholesterol [E78.00] 07/08/2007  . Depression [F32.9] 07/08/2007  . EXTERNAL HEMORRHOIDS [K64.4] 07/08/2007  . DIVERTICULOSIS, COLON [K57.30] 07/08/2007  . DISC DISEASE, LUMBAR [M51.37] 07/08/2007    Total Time spent with patient: 30 minutes  Musculoskeletal: Strength & Muscle Tone: within normal limits Gait & Station: normal Patient leans: N/A  Psychiatric Specialty Exam: ROS denies chest pain, no shortness of breath, no vomiting   Blood pressure 132/68, pulse 78, temperature 97.9 F (36.6 C), temperature source Oral, resp. rate 16, height 5' 9.29" (1.76 m), weight 80.7 kg (177 lb 14.4 oz).Body mass index is 26.05 kg/m.  General Appearance: Well Groomed  Eye Contact::  Good  Speech:  Normal Rate409  Volume:  Normal  Mood:  improved mood, describes mood as 8/10 today  Affect:  Appropriate and fuller in range  Thought Process:  Linear  Orientation:  Full (Time, Place, and Person)  Thought Content:  denies hallucinations, no delusions  Suicidal Thoughts:  No- denies any  Suicidal or self injurious ideations, no violent or homicidal ideations  Homicidal Thoughts:  No  Memory:  recent and remote grossly intact   Judgement:  Other:  improved   Insight:  improved   Psychomotor Activity:  Normal  Concentration:  Good  Recall:  Good  Fund of Knowledge:Good  Language: Good   Akathisia:  Negative  Handed:  Right  AIMS (if indicated):     Assets:  Desire for Improvement Resilience  Sleep:  Number of Hours: 6.5  Cognition: WNL  ADL's:  Intact   Mental Status Per Nursing Assessment::   On Admission:  Suicidal ideation indicated by patient  Demographic Factors:  61 year old male, has three children.   Loss Factors: Relapse, job loss   Historical Factors: History of polysubstance dependence- history of alcohol abuse, stopped alcohol several years ago, more recently opiate and cocaine abuse. History of depression.   Risk Reduction Factors:   Positive coping skills or problem solving skills  Continued Clinical Symptoms:  At this time patient is alert, attentive, well related, calm, no current withdrawal symptoms noted or reported, mood is described as improved and range of affect is fuller, no thought disorder, no suicidal ideations, no homicidal ideations, no hallucinations, no delusions, future oriented Denies medication side effects  Cognitive Features That Contribute To Risk:  No gross cognitive deficits noted upon discharge. Is alert , attentive, and oriented x 3   Suicide Risk:  Mild:  Suicidal ideation of limited frequency, intensity, duration, and specificity.  There are no identifiable plans, no associated intent, mild dysphoria and related symptoms, good self-control (both objective and subjective assessment), few other risk factors, and identifiable protective factors, including available and accessible social support.  Follow-up Information    [facility] Follow up on 09/12/2016.   Why:  You have been accepted for treatment on this date. [facility] transport will pick you up at 9:30AM. Please make sure that you have 21 day supply of medications. Thank you.  Contact  information: [address] [place], [place] [address] Phone: [phone] Fax: [phone]          Plan Of Care/Follow-up recommendations:  Activity:  as tolerated   Diet:  regular Tests:  NA Other:  See below  Patient is leaving in good spirits  [facility] medical issues as needed   Nehemiah MassedOBOS, FERNANDO, MD 09/12/2016, 9:08 AM

## 2016-09-12 NOTE — Tx Team (Signed)
Interdisciplinary Treatment and Diagnostic Plan Update  09/12/2016 Time of Session: 8:57 AM  Orlo Brickle Dahms MRN: [id number]  Principal Diagnosis: Opioid dependence with opioid-induced mood disorder ([place])  Secondary Diagnoses: Principal Problem:   Opioid dependence with opioid-induced mood disorder ([place]) Active Problems:   Major depressive disorder without psychotic features   Severe episode of recurrent major depressive disorder, without psychotic features ([place])   Current Medications:  Current Facility-Administered Medications  Medication Dose Route Frequency Provider Last Rate Last Dose  . acetaminophen (TYLENOL) tablet 650 mg  650 mg Oral Q6H PRN Rozetta Nunnery, NP   650 mg at 09/11/16 1625  . alum & mag hydroxide-simeth (MAALOX/MYLANTA) 200-200-20 MG/5ML suspension 30 mL  30 mL Oral Q4H PRN Rozetta Nunnery, NP      . busPIRone (BUSPAR) tablet 10 mg  10 mg Oral BID Kerrie Buffalo, NP   10 mg at 09/12/16 0753  . cloNIDine (CATAPRES) tablet 0.1 mg  0.1 mg Oral QAC breakfast Encarnacion Slates, NP   0.1 mg at 09/12/16 0750  . dicyclomine (BENTYL) tablet 20 mg  20 mg Oral Q6H PRN Encarnacion Slates, NP      . DULoxetine (CYMBALTA) DR capsule 60 mg  60 mg Oral Daily Rozetta Nunnery, NP   60 mg at 09/12/16 0750  . hydrOXYzine (ATARAX/VISTARIL) tablet 25 mg  25 mg Oral Q6H PRN Encarnacion Slates, NP   25 mg at 09/10/16 1847  . loperamide (IMODIUM) capsule 2-4 mg  2-4 mg Oral PRN Encarnacion Slates, NP      . magnesium hydroxide (MILK OF MAGNESIA) suspension 30 mL  30 mL Oral Daily PRN Rozetta Nunnery, NP   30 mL at 09/11/16 2102  . methocarbamol (ROBAXIN) tablet 500 mg  500 mg Oral Q8H PRN Encarnacion Slates, NP   500 mg at 09/11/16 2103  . naproxen (NAPROSYN) tablet 500 mg  500 mg Oral BID PRN Encarnacion Slates, NP   500 mg at 09/11/16 2103  . ondansetron (ZOFRAN-ODT) disintegrating tablet 4 mg  4 mg Oral Q6H PRN Encarnacion Slates, NP   4 mg at 09/11/16 1436  . pravastatin (PRAVACHOL) tablet 40 mg  40 mg Oral Daily Rozetta Nunnery, NP   40 mg at 09/12/16 0750  . traZODone (DESYREL) tablet 150 mg  150 mg Oral QHS PRN Encarnacion Slates, NP   150 mg at 09/11/16 2228    PTA Medications: Prescriptions Prior to Admission  Medication Sig Dispense Refill Last Dose  . busPIRone (BUSPAR) 7.5 MG tablet TAKE 1 TABLET(7.5 MG) BY MOUTH TWICE DAILY 60 tablet 2 09/06/2016 at 0900  . cyclobenzaprine (FLEXERIL) 5 MG tablet TAKE 1 TABLET BY MOUTH THREE TIMES DAILY AS NEEDED FOR MUSCLE SPASMS 90 tablet 0 Past Week at Unknown time  . DULoxetine (CYMBALTA) 60 MG capsule Take 1 capsule (60 mg total) by mouth daily. 90 capsule 2 09/07/2016 at 0900  . pravastatin (PRAVACHOL) 40 MG tablet Take 1 tablet (40 mg total) by mouth daily. 90 tablet 3 09/07/2016 at 0900  . traMADol (ULTRAM) 50 MG tablet TAKE 1 TABLET BY MOUTH EVERY 12 HOURS AS NEEDED FOR MODERATE PAIN IN LOWER BACK 60 tablet 2 Past Week at Unknown time  . traZODone (DESYREL) 150 MG tablet TAKE 1 TABLET(150 MG) BY MOUTH AT BEDTIME AS NEEDED FOR SLEEP 30 tablet 0 09/06/2016    Treatment Modalities: Medication Management, Group therapy, Case management,  1 to 1 session with clinician, Psychoeducation, Recreational  therapy.  Patient Stressors: Arts development officer issue Substance abuse  Patient Strengths: Capable of independent living Curator fund of knowledge Motivation for treatment/growth  Physician Treatment Plan for Primary Diagnosis: Opioid dependence with opioid-induced mood disorder ([place]) Long Term Goal(s): Improvement in symptoms so as ready for discharge  Short Term Goals: Ability to identify changes in lifestyle to reduce recurrence of condition will improve Ability to verbalize feelings will improve Ability to disclose and discuss suicidal ideas Ability to demonstrate self-control will improve Ability to identify and develop effective coping behaviors will improve Ability to verbalize feelings will improve Ability to disclose and discuss  suicidal ideas Ability to demonstrate self-control will improve Ability to identify and develop effective coping behaviors will improve Compliance with prescribed medications will improve Ability to identify triggers associated with substance abuse/mental health issues will improve  Medication Management: Evaluate patient's response, side effects, and tolerance of medication regimen.  Therapeutic Interventions: 1 to 1 sessions, Unit Group sessions and Medication administration.  Evaluation of Outcomes: Met  Physician Treatment Plan for Secondary Diagnosis: Principal Problem:   Opioid dependence with opioid-induced mood disorder ([facility]) Active Problems:   Major depressive disorder without psychotic features   Severe episode of recurrent major depressive disorder, without psychotic features ([place])   Long Term Goal(s): Improvement in symptoms so as ready for discharge  Short Term Goals: Ability to identify changes in lifestyle to reduce recurrence of condition will improve Ability to verbalize feelings will improve Ability to disclose and discuss suicidal ideas Ability to demonstrate self-control will improve Ability to identify and develop effective coping behaviors will improve Ability to verbalize feelings will improve Ability to disclose and discuss suicidal ideas Ability to demonstrate self-control will improve Ability to identify and develop effective coping behaviors will improve Compliance with prescribed medications will improve Ability to identify triggers associated with substance abuse/mental health issues will improve  Medication Management: Evaluate patient's response, side effects, and tolerance of medication regimen.  Therapeutic Interventions: 1 to 1 sessions, Unit Group sessions and Medication administration.  Evaluation of Outcomes: Met   RN Treatment Plan for Primary Diagnosis: Opioid dependence with opioid-induced mood disorder ([facility]) Long Term Goal(s):  Knowledge of disease and therapeutic regimen to maintain health will improve  Short Term Goals: Ability to verbalize feelings will improve, Ability to disclose and discuss suicidal ideas and Ability to identify and develop effective coping behaviors will improve  Medication Management: RN will administer medications as ordered by provider, will assess and evaluate patient's response and provide education to patient for prescribed medication. RN will report any adverse and/or side effects to prescribing provider.  Therapeutic Interventions: 1 on 1 counseling sessions, Psychoeducation, Medication administration, Evaluate responses to treatment, Monitor vital signs and CBGs as ordered, Perform/monitor CIWA, COWS, AIMS and Fall Risk screenings as ordered, Perform wound care treatments as ordered.  Evaluation of Outcomes: Met   LCSW Treatment Plan for Primary Diagnosis: Opioid dependence with opioid-induced mood disorder ([place]) Long Term Goal(s): Safe transition to appropriate next level of care at discharge, Engage patient in therapeutic group addressing interpersonal concerns.  Short Term Goals: Engage patient in aftercare planning with referrals and resources, Facilitate patient progression through stages of change regarding substance use diagnoses and concerns, Identify triggers associated with mental health/substance abuse issues and Increase skills for wellness and recovery  Therapeutic Interventions: Assess for all discharge needs, 1 to 1 time with Social worker, Explore available resources and support systems, Assess for adequacy in community support network, Educate family and significant  other(s) on suicide prevention, Complete Psychosocial Assessment, Interpersonal group therapy.  Evaluation of Outcomes: met   Progress in Treatment: Attending groups: Yes Participating in groups: Yes Taking medication as prescribed: Yes Toleration medication: Yes, no side effects reported at this  time Family/Significant other contact made: SPE completed with pt's exwife.  Patient understands diagnosis: Yes Discussing patient identified problems/goals with staff: Yes Medical problems stabilized or resolved: Yes Denies suicidal/homicidal ideation: Yes Issues/concerns per patient self-inventory: None Other: N/A  New problem(s) identified: None identified at this time.   New Short Term/Long Term Goal(s): None identified at this time.   Discharge Plan or Barriers: Pt has been [facility] to [facility] and will be transported to facility at 9:30AM this morning.   Reason for Continuation of Hospitalization: none  Estimated Length of Stay: d/c today  Attendees: Patient: 09/12/2016  8:57 AM  Physician: Dr. Parke Poisson; Dr. Shea Evans 09/12/2016  8:57 AM  Nursing: Particia Lather RN 09/12/2016  8:57 AM  RN Care Manager: Lars Pinks, RN 09/12/2016  8:57 AM  Social Worker: Adriana Reams, LCSW; [place], LCSW 09/12/2016  8:57 AM  Recreational Therapist:  09/12/2016  8:57 AM  Other: Lindell Spar, NP; Samuel Jester NP 09/12/2016  8:57 AM  Other:  09/12/2016  8:57 AM  Other: 09/12/2016  8:57 AM    Scribe for Treatment Team: Maxie Better, MSW, LCSW Clinical Social Worker 09/12/2016 8:59 AM

## 2016-10-09 ENCOUNTER — Encounter: Payer: Self-pay | Admitting: Family Medicine

## 2016-10-09 ENCOUNTER — Ambulatory Visit: Payer: Medicaid Other | Attending: Family Medicine | Admitting: Family Medicine

## 2016-10-09 VITALS — BP 113/73 | HR 91 | Temp 98.1°F | Ht 69.0 in | Wt 188.4 lb

## 2016-10-09 DIAGNOSIS — K5900 Constipation, unspecified: Secondary | ICD-10-CM | POA: Diagnosis not present

## 2016-10-09 DIAGNOSIS — E7800 Pure hypercholesterolemia, unspecified: Secondary | ICD-10-CM | POA: Diagnosis not present

## 2016-10-09 DIAGNOSIS — F329 Major depressive disorder, single episode, unspecified: Secondary | ICD-10-CM | POA: Diagnosis not present

## 2016-10-09 DIAGNOSIS — F1410 Cocaine abuse, uncomplicated: Secondary | ICD-10-CM | POA: Diagnosis not present

## 2016-10-09 DIAGNOSIS — F321 Major depressive disorder, single episode, moderate: Secondary | ICD-10-CM

## 2016-10-09 DIAGNOSIS — Z79899 Other long term (current) drug therapy: Secondary | ICD-10-CM | POA: Insufficient documentation

## 2016-10-09 DIAGNOSIS — F419 Anxiety disorder, unspecified: Secondary | ICD-10-CM | POA: Insufficient documentation

## 2016-10-09 MED ORDER — POLYETHYLENE GLYCOL 3350 17 GM/SCOOP PO POWD: 17.0000 g | Freq: Every day | ORAL | 0 refills | Status: DC

## 2016-10-09 MED ORDER — HYDROXYZINE HCL 25 MG PO TABS: 25.0000 mg | ORAL_TABLET | Freq: Four times a day (QID) | ORAL | 3 refills | Status: DC | PRN

## 2016-10-09 MED ORDER — POLYETHYLENE GLYCOL 3350 17 GM/SCOOP PO POWD: 17.0000 g | Freq: Every day | ORAL | 3 refills | Status: DC

## 2016-10-09 MED ORDER — BUSPIRONE HCL 10 MG PO TABS: 10.0000 mg | ORAL_TABLET | Freq: Two times a day (BID) | ORAL | 3 refills | Status: DC

## 2016-10-09 MED ORDER — PRAVASTATIN SODIUM 40 MG PO TABS: 40.0000 mg | ORAL_TABLET | Freq: Every day | ORAL | 3 refills | Status: DC

## 2016-10-09 MED ORDER — PRAVASTATIN SODIUM 40 MG PO TABS: 40.0000 mg | ORAL_TABLET | Freq: Every day | ORAL | 0 refills | Status: DC

## 2016-10-09 MED ORDER — DULOXETINE HCL 60 MG PO CPEP: 60.0000 mg | ORAL_CAPSULE | Freq: Every day | ORAL | 3 refills | Status: DC

## 2016-10-09 MED ORDER — TRAZODONE HCL 150 MG PO TABS: 150.0000 mg | ORAL_TABLET | Freq: Every evening | ORAL | 3 refills | Status: DC | PRN

## 2016-10-09 MED ORDER — HYDROXYZINE HCL 25 MG PO TABS: 25.0000 mg | ORAL_TABLET | Freq: Four times a day (QID) | ORAL | 0 refills | Status: DC | PRN

## 2016-10-09 MED ORDER — BUSPIRONE HCL 10 MG PO TABS: 10.0000 mg | ORAL_TABLET | Freq: Two times a day (BID) | ORAL | 0 refills | Status: DC

## 2016-10-09 MED ORDER — TRAZODONE HCL 150 MG PO TABS: 150.0000 mg | ORAL_TABLET | Freq: Every evening | ORAL | 0 refills | Status: DC | PRN

## 2016-10-09 MED ORDER — DULOXETINE HCL 60 MG PO CPEP: 60.0000 mg | ORAL_CAPSULE | Freq: Every day | ORAL | 0 refills | Status: DC

## 2016-10-09 NOTE — Assessment & Plan Note (Signed)
Stable with current regimen Continue current regimen

## 2016-10-09 NOTE — Progress Notes (Signed)
Subjective:  Patient ID: Marvin Clark, male    DOB: 1955/12/14  Age: 61 y.o. MRN: [id number]  CC: Depression   HPI Marvin Clark for   1. Hospitalization follow up for depression: he was [facility]09/12/[facility] from substance abuse mood disorder. His flexeril and tramadol were discontinued. He was [place] [facility] [place]  for a 14 day treatment.   Today, he reports sober or 34 days. He was using cocaine prior. He is back in [employer] meetings. He is considering going back [facility] for additional in-patient treatment.   He declines flu shot.   Social History  Substance Use Topics  . Smoking status: Never Smoker  . Smokeless tobacco: Never Used  . Alcohol use No    Outpatient Medications Prior to Visit  Medication Sig Dispense Refill  . busPIRone (BUSPAR) 10 MG tablet Take 1 tablet (10 mg total) by mouth 2 (two) times daily. 60 tablet 0  . DULoxetine (CYMBALTA) 60 MG capsule Take 1 capsule (60 mg total) by mouth daily. 30 capsule 0  . hydrOXYzine (ATARAX/VISTARIL) 25 MG tablet Take 1 tablet (25 mg total) by mouth every 6 (six) hours as needed for anxiety. 30 tablet 0  . pravastatin (PRAVACHOL) 40 MG tablet Take 1 tablet (40 mg total) by mouth daily. 30 tablet 0  . traZODone (DESYREL) 150 MG tablet Take 1 tablet (150 mg total) by mouth at bedtime as needed for sleep. 30 tablet 0   No facility-administered medications prior to visit.     ROS Review of Systems  Constitutional: Negative for chills, fatigue, fever and unexpected weight change.  Eyes: Negative for visual disturbance.  Respiratory: Negative for cough and shortness of breath.   Cardiovascular: Negative for chest pain, palpitations and leg swelling.  Gastrointestinal: Positive for constipation. Negative for abdominal pain, blood in stool, diarrhea, nausea and vomiting.  Endocrine: Negative for polydipsia, polyphagia and polyuria.    Musculoskeletal: Negative for arthralgias, back pain, gait problem, myalgias and neck pain.  Skin: Negative for rash.  Allergic/Immunologic: Negative for immunocompromised state.  Hematological: Negative for adenopathy. Does not bruise/bleed easily.  Psychiatric/Behavioral: Negative for dysphoric mood, sleep disturbance and suicidal ideas. The patient is not nervous/anxious.     Objective:  BP 113/73 (BP Location: Left Arm, Patient Position: Sitting, Cuff Size: Small)   Pulse 91   Temp 98.1 F (36.7 C) (Oral)   Ht 5\' 9"  (1.753 m)   Wt 188 lb 6.4 oz (85.5 kg)   SpO2 97%   BMI 27.82 kg/m   BP/Weight 10/09/2016 09/12/2016 09/07/2016  Systolic BP 113 132 -  Diastolic BP 73 68 -  Wt. (Lbs) 188.4 - 177.9  BMI 27.82 - 26.05   BP Readings from Last 3 Encounters:  10/09/16 113/73  09/12/16 132/68  06/23/16 100/64   Wt Readings from Last 3 Encounters:  10/09/16 188 lb 6.4 oz (85.5 kg)  09/07/16 177 lb 14.4 oz (80.7 kg)  06/23/16 173 lb 9.6 oz (78.7 kg)    Physical Exam  Constitutional: He appears well-developed and well-nourished. No distress.  HENT:  Head: Normocephalic and atraumatic.  Neck: Normal range of motion. Neck supple.  Cardiovascular: Normal rate, regular rhythm, normal heart sounds and intact distal pulses.   Pulmonary/Chest: Effort normal and breath sounds normal.  Musculoskeletal: He exhibits no edema.  Neurological: He is alert.  Skin: Skin is warm and dry. No rash noted. No erythema.  Psychiatric: He has a normal  mood and affect.    Assessment & Plan:  Marvin Clark was seen today for depression.  Diagnoses and all orders for this visit:  Moderate single current episode of major depressive disorder (HCC) -     Discontinue: busPIRone (BUSPAR) 10 MG tablet; Take 1 tablet (10 mg total) by mouth 2 (two) times daily. -     Discontinue: DULoxetine (CYMBALTA) 60 MG capsule; Take 1 capsule (60 mg total) by mouth daily. -     Discontinue: hydrOXYzine (ATARAX/VISTARIL) 25  MG tablet; Take 1 tablet (25 mg total) by mouth every 6 (six) hours as needed for anxiety. -     Discontinue: traZODone (DESYREL) 150 MG tablet; Take 1 tablet (150 mg total) by mouth at bedtime as needed for sleep. -     busPIRone (BUSPAR) 10 MG tablet; Take 1 tablet (10 mg total) by mouth 2 (two) times daily. -     DULoxetine (CYMBALTA) 60 MG capsule; Take 1 capsule (60 mg total) by mouth daily. -     hydrOXYzine (ATARAX/VISTARIL) 25 MG tablet; Take 1 tablet (25 mg total) by mouth every 6 (six) hours as needed for anxiety. -     traZODone (DESYREL) 150 MG tablet; Take 1 tablet (150 mg total) by mouth at bedtime as needed for sleep.  High cholesterol -     Discontinue: pravastatin (PRAVACHOL) 40 MG tablet; Take 1 tablet (40 mg total) by mouth daily. -     pravastatin (PRAVACHOL) 40 MG tablet; Take 1 tablet (40 mg total) by mouth daily.  Constipation, unspecified constipation type -     Discontinue: polyethylene glycol powder (GLYCOLAX/MIRALAX) powder; Take 17 g by mouth daily. -     polyethylene glycol powder (GLYCOLAX/MIRALAX) powder; Take 17 g by mouth daily.  Cocaine abuse   There are no diagnoses linked to this encounter.  No orders of the defined types were placed in this encounter.   Follow-up: Return in about 4 weeks (around 11/06/2016) for depression .   Dessa Phi MD

## 2016-10-09 NOTE — Patient Instructions (Addendum)
Marvin Clark was seen today for depression.  Diagnoses and all orders for this visit:  Moderate single current episode of major depressive disorder (HCC) -     Discontinue: busPIRone (BUSPAR) 10 MG tablet; Take 1 tablet (10 mg total) by mouth 2 (two) times daily. -     Discontinue: DULoxetine (CYMBALTA) 60 MG capsule; Take 1 capsule (60 mg total) by mouth daily. -     Discontinue: hydrOXYzine (ATARAX/VISTARIL) 25 MG tablet; Take 1 tablet (25 mg total) by mouth every 6 (six) hours as needed for anxiety. -     Discontinue: traZODone (DESYREL) 150 MG tablet; Take 1 tablet (150 mg total) by mouth at bedtime as needed for sleep. -     busPIRone (BUSPAR) 10 MG tablet; Take 1 tablet (10 mg total) by mouth 2 (two) times daily. -     DULoxetine (CYMBALTA) 60 MG capsule; Take 1 capsule (60 mg total) by mouth daily. -     hydrOXYzine (ATARAX/VISTARIL) 25 MG tablet; Take 1 tablet (25 mg total) by mouth every 6 (six) hours as needed for anxiety. -     traZODone (DESYREL) 150 MG tablet; Take 1 tablet (150 mg total) by mouth at bedtime as needed for sleep.  High cholesterol -     Discontinue: pravastatin (PRAVACHOL) 40 MG tablet; Take 1 tablet (40 mg total) by mouth daily. -     pravastatin (PRAVACHOL) 40 MG tablet; Take 1 tablet (40 mg total) by mouth daily.  Constipation, unspecified constipation type -     polyethylene glycol powder (GLYCOLAX/MIRALAX) powder; Take 17 g by mouth daily.   F/u in 1 month for depression   Dr. Armen PickupFunches

## 2016-10-09 NOTE — Assessment & Plan Note (Signed)
In remission Patient plans to continue in patient [facility] at [facility] starting 10/11/2016

## 2016-12-22 ENCOUNTER — Ambulatory Visit: Payer: Medicaid Other | Attending: Family Medicine | Admitting: Family Medicine

## 2016-12-22 ENCOUNTER — Encounter: Payer: Self-pay | Admitting: Family Medicine

## 2016-12-22 VITALS — BP 107/69 | HR 67 | Temp 97.8°F | Ht 69.0 in | Wt 198.4 lb

## 2016-12-22 DIAGNOSIS — F332 Major depressive disorder, recurrent severe without psychotic features: Secondary | ICD-10-CM

## 2016-12-22 DIAGNOSIS — M545 Low back pain: Secondary | ICD-10-CM | POA: Insufficient documentation

## 2016-12-22 DIAGNOSIS — F419 Anxiety disorder, unspecified: Secondary | ICD-10-CM | POA: Insufficient documentation

## 2016-12-22 DIAGNOSIS — Z0000 Encounter for general adult medical examination without abnormal findings: Secondary | ICD-10-CM

## 2016-12-22 DIAGNOSIS — M5137 Other intervertebral disc degeneration, lumbosacral region: Secondary | ICD-10-CM | POA: Diagnosis not present

## 2016-12-22 DIAGNOSIS — F321 Major depressive disorder, single episode, moderate: Secondary | ICD-10-CM | POA: Diagnosis not present

## 2016-12-22 DIAGNOSIS — E7800 Pure hypercholesterolemia, unspecified: Secondary | ICD-10-CM | POA: Diagnosis not present

## 2016-12-22 DIAGNOSIS — F329 Major depressive disorder, single episode, unspecified: Secondary | ICD-10-CM | POA: Diagnosis present

## 2016-12-22 MED ORDER — BUSPIRONE HCL 10 MG PO TABS: 10.0000 mg | ORAL_TABLET | Freq: Three times a day (TID) | ORAL | 5 refills | Status: DC

## 2016-12-22 MED ORDER — CYCLOBENZAPRINE HCL 10 MG PO TABS: 10.0000 mg | ORAL_TABLET | Freq: Every evening | ORAL | 2 refills | Status: DC | PRN

## 2016-12-22 MED ORDER — HYDROXYZINE HCL 25 MG PO TABS: 25.0000 mg | ORAL_TABLET | Freq: Four times a day (QID) | ORAL | 3 refills | Status: DC | PRN

## 2016-12-22 MED ORDER — DULOXETINE HCL 60 MG PO CPEP: 60.0000 mg | ORAL_CAPSULE | Freq: Every day | ORAL | 5 refills | Status: DC

## 2016-12-22 MED ORDER — PRAVASTATIN SODIUM 40 MG PO TABS: 40.0000 mg | ORAL_TABLET | Freq: Every day | ORAL | 5 refills | Status: DC

## 2016-12-22 MED ORDER — CYCLOBENZAPRINE HCL 10 MG PO TABS: 10.0000 mg | ORAL_TABLET | Freq: Every evening | ORAL | 5 refills | Status: DC | PRN

## 2016-12-22 MED ORDER — HYDROXYZINE HCL 25 MG PO TABS: 25.0000 mg | ORAL_TABLET | Freq: Four times a day (QID) | ORAL | 5 refills | Status: DC | PRN

## 2016-12-22 MED ORDER — TRAZODONE HCL 150 MG PO TABS: 150.0000 mg | ORAL_TABLET | Freq: Every evening | ORAL | 5 refills | Status: DC | PRN

## 2016-12-22 NOTE — Progress Notes (Signed)
Subjective:  Patient ID: Marvin Clark, male    DOB: 08/17/1956  Age: 61 y.o. MRN: [id number]  CC: Depression   HPI Malakye Nolden Lahm presents for   1. Depression and anxiety: he was hospitalized from [facility]/4-09/12/2016 at [facility] from substance abuse mood disorder. His flexeril and tramadol were discontinued. He was discharged to [facility] in [place]  for a 14 day treatment. He reports mood is doing well. He is working. He is taking Buspar 10 mg BID, Cymbalta 60 mg daily and hydroxyzine 25 mg 2-3 times daily. He request Rx for 60 hydroxyzine.   2. Back pain: he started a new job. He lifts up to 30 lbs at work. He does repetitive lifting and bending. He reports low back pain at the end of his work day. He request Rx for flexeril.   Social History  Substance Use Topics  . Smoking status: Never Smoker  . Smokeless tobacco: Never Used  . Alcohol use No    Outpatient Medications Prior to Visit  Medication Sig Dispense Refill  . busPIRone (BUSPAR) 10 MG tablet Take 1 tablet (10 mg total) by mouth 2 (two) times daily. 60 tablet 3  . DULoxetine (CYMBALTA) 60 MG capsule Take 1 capsule (60 mg total) by mouth daily. 30 capsule 3  . hydrOXYzine (ATARAX/VISTARIL) 25 MG tablet Take 1 tablet (25 mg total) by mouth every 6 (six) hours as needed for anxiety. 30 tablet 3  . polyethylene glycol powder (GLYCOLAX/MIRALAX) powder Take 17 g by mouth daily. 551 g 3  . pravastatin (PRAVACHOL) 40 MG tablet Take 1 tablet (40 mg total) by mouth daily. 30 tablet 3  . traZODone (DESYREL) 150 MG tablet Take 1 tablet (150 mg total) by mouth at bedtime as needed for sleep. 30 tablet 3   No facility-administered medications prior to visit.     ROS Review of Systems  Constitutional: Negative for chills, fatigue, fever and unexpected weight change.  Eyes: Negative for visual disturbance.  Respiratory: Negative for cough and shortness of breath.   Cardiovascular: Negative  for chest pain, palpitations and leg swelling.  Gastrointestinal: Negative for abdominal pain, blood in stool, constipation, diarrhea, nausea and vomiting.  Endocrine: Negative for polydipsia, polyphagia and polyuria.  Musculoskeletal: Positive for back pain. Negative for arthralgias, gait problem, myalgias and neck pain.  Skin: Negative for rash.  Allergic/Immunologic: Negative for immunocompromised state.  Hematological: Negative for adenopathy. Does not bruise/bleed easily.  Psychiatric/Behavioral: Negative for dysphoric mood, sleep disturbance and suicidal ideas. The patient is not nervous/anxious.     Objective:  BP 107/69   Pulse 67   Temp 97.8 F (36.6 C) (Oral)   Ht  (1.753 m)   Wt 198 lb 6.4 oz (90 kg)   SpO2 96%   BMI 29.30 kg/m   BP/Weight 12/22/2016 10/09/2016 06/23/2016  Systolic BP 107 113 100  Diastolic BP 69 73 64  Wt. (Lbs) 198.4 188.4 173.6  BMI 29.3 27.82 25.64  Some encounter information is confidential and restricted. Go to Review Flowsheets activity to see all data.   BP Readings from Last 3 Encounters:  10/09/16 113/73  06/23/16 100/64  03/10/16 113/69   Wt Readings from Last 3 Encounters:  10/09/16 188 lb 6.4 oz (85.5 kg)  06/23/16 173 lb 9.6 oz (78.7 kg)  03/10/16 167 lb (75.8 kg)    Physical Exam  Constitutional: He appears well-developed and well-nourished. No distress.  HENT:  Head: Normocephalic and atraumatic.  Neck: Normal range  of motion. Neck supple.  Cardiovascular: Normal rate, regular rhythm, normal heart sounds and intact distal pulses.   Pulmonary/Chest: Effort normal and breath sounds normal.  Musculoskeletal: He exhibits no edema.  Neurological: He is alert.  Skin: Skin is warm and dry. No rash noted. No erythema.  Psychiatric: He has a normal mood and affect.   Depression screen [facility] 2/9 12/22/2016 10/09/2016 06/23/2016  Decreased Interest Down, Depressed, Hopeless PHQ - 2 Score Altered sleeping Tired, decreased energy Change in appetite 1 0 0  Feeling bad or failure about yourself  0 1 1  Trouble concentrating 0 0 1  Moving slowly or fidgety/restless 0 0 0  Suicidal thoughts 0 0 0  PHQ-9 Score GAD 7 : Generalized Anxiety Score 12/22/2016 10/09/2016 06/23/2016 02/03/2016  Nervous, Anxious, on Edge 1 2 0 1  Control/stop worrying 0 Worry too much - different things 0 Trouble relaxing Restless 0 1 0 0  Easily annoyed or irritable 0 1 1 0  Afraid - awful might happen 0 0 0 0  Total GAD 7 Score Assessment & Plan:  Zian was seen today for depression.  Diagnoses and all orders for this visit:  DISC DISEASE, LUMBAR -     Discontinue: cyclobenzaprine (FLEXERIL) 10 MG tablet; Take 1 tablet (10 mg total) by mouth at bedtime as needed for muscle spasms. -     cyclobenzaprine (FLEXERIL) 10 MG tablet; Take 1 tablet (10 mg total) by mouth at bedtime as needed for muscle spasms.  Moderate single current episode of major depressive disorder (HCC) -     Discontinue: hydrOXYzine (ATARAX/VISTARIL) 25 MG tablet; Take 1 tablet (25 mg total) by mouth every 6 (six) hours as needed for anxiety. -     DULoxetine (CYMBALTA) 60 MG capsule; Take 1 capsule (60 mg total) by mouth daily. -     busPIRone (BUSPAR) 10 MG tablet; Take 1 tablet (10 mg total) by mouth 3 (three) times daily. -     traZODone (DESYREL) 150 MG tablet; Take 1 tablet (150 mg total) by mouth at bedtime as needed for sleep. -     hydrOXYzine (ATARAX/VISTARIL) 25 MG tablet; Take 1 tablet (25 mg total) by mouth every 6 (six) hours as needed for anxiety.  High cholesterol -     pravastatin (PRAVACHOL) 40 MG tablet; Take 1 tablet (40 mg total) by mouth daily.  Healthcare maintenance -     Ambulatory referral to Gastroenterology   There are no diagnoses linked to this encounter.  No orders of the defined types were placed in this encounter.   Follow-up: Return in about 3  months (around 03/23/2017) for anxiety and depression .   Dessa Phi MD

## 2016-12-22 NOTE — Patient Instructions (Addendum)
Marvin Clark was seen today for depression.  Diagnoses and all orders for this visit:  DISC DISEASE, LUMBAR -     Discontinue: cyclobenzaprine (FLEXERIL) 10 MG tablet; Take 1 tablet (10 mg total) by mouth at bedtime as needed for muscle spasms. -     cyclobenzaprine (FLEXERIL) 10 MG tablet; Take 1 tablet (10 mg total) by mouth at bedtime as needed for muscle spasms.  Moderate single current episode of major depressive disorder (HCC) -     Discontinue: hydrOXYzine (ATARAX/VISTARIL) 25 MG tablet; Take 1 tablet (25 mg total) by mouth every 6 (six) hours as needed for anxiety. -     DULoxetine (CYMBALTA) 60 MG capsule; Take 1 capsule (60 mg total) by mouth daily. -     busPIRone (BUSPAR) 10 MG tablet; Take 1 tablet (10 mg total) by mouth 3 (three) times daily. -     traZODone (DESYREL) 150 MG tablet; Take 1 tablet (150 mg total) by mouth at bedtime as needed for sleep. -     hydrOXYzine (ATARAX/VISTARIL) 25 MG tablet; Take 1 tablet (25 mg total) by mouth every 6 (six) hours as needed for anxiety.  High cholesterol -     pravastatin (PRAVACHOL) 40 MG tablet; Take 1 tablet (40 mg total) by mouth daily.  Healthcare maintenance -     Ambulatory referral to Gastroenterology   F/u in 3 months for anxiety and depression  Dr. Armen Pickup

## 2016-12-24 NOTE — Assessment & Plan Note (Signed)
Back pain following repetitive work Plan: Flexeril refilled  NSAID, heat and topical pain relief creams recommended

## 2016-12-24 NOTE — Assessment & Plan Note (Signed)
Mood stable Continue current regimen Continue increase in Buspar if anxiety uncontrolled

## 2017-01-17 ENCOUNTER — Encounter: Payer: Self-pay | Admitting: Family Medicine

## 2017-01-24 ENCOUNTER — Encounter: Payer: Self-pay | Admitting: Family Medicine

## 2017-03-23 ENCOUNTER — Ambulatory Visit: Payer: Self-pay | Admitting: Family Medicine

## 2017-04-04 IMAGING — DX DG CHEST 2V
2 series · 2 of 2 positions shown · non-contrast
Comparison: None.

CLINICAL DATA: Acute onset of shortness of breath and generalized
chest pain. Initial encounter.

EXAM:
CHEST  2 VIEW

[chest pa]
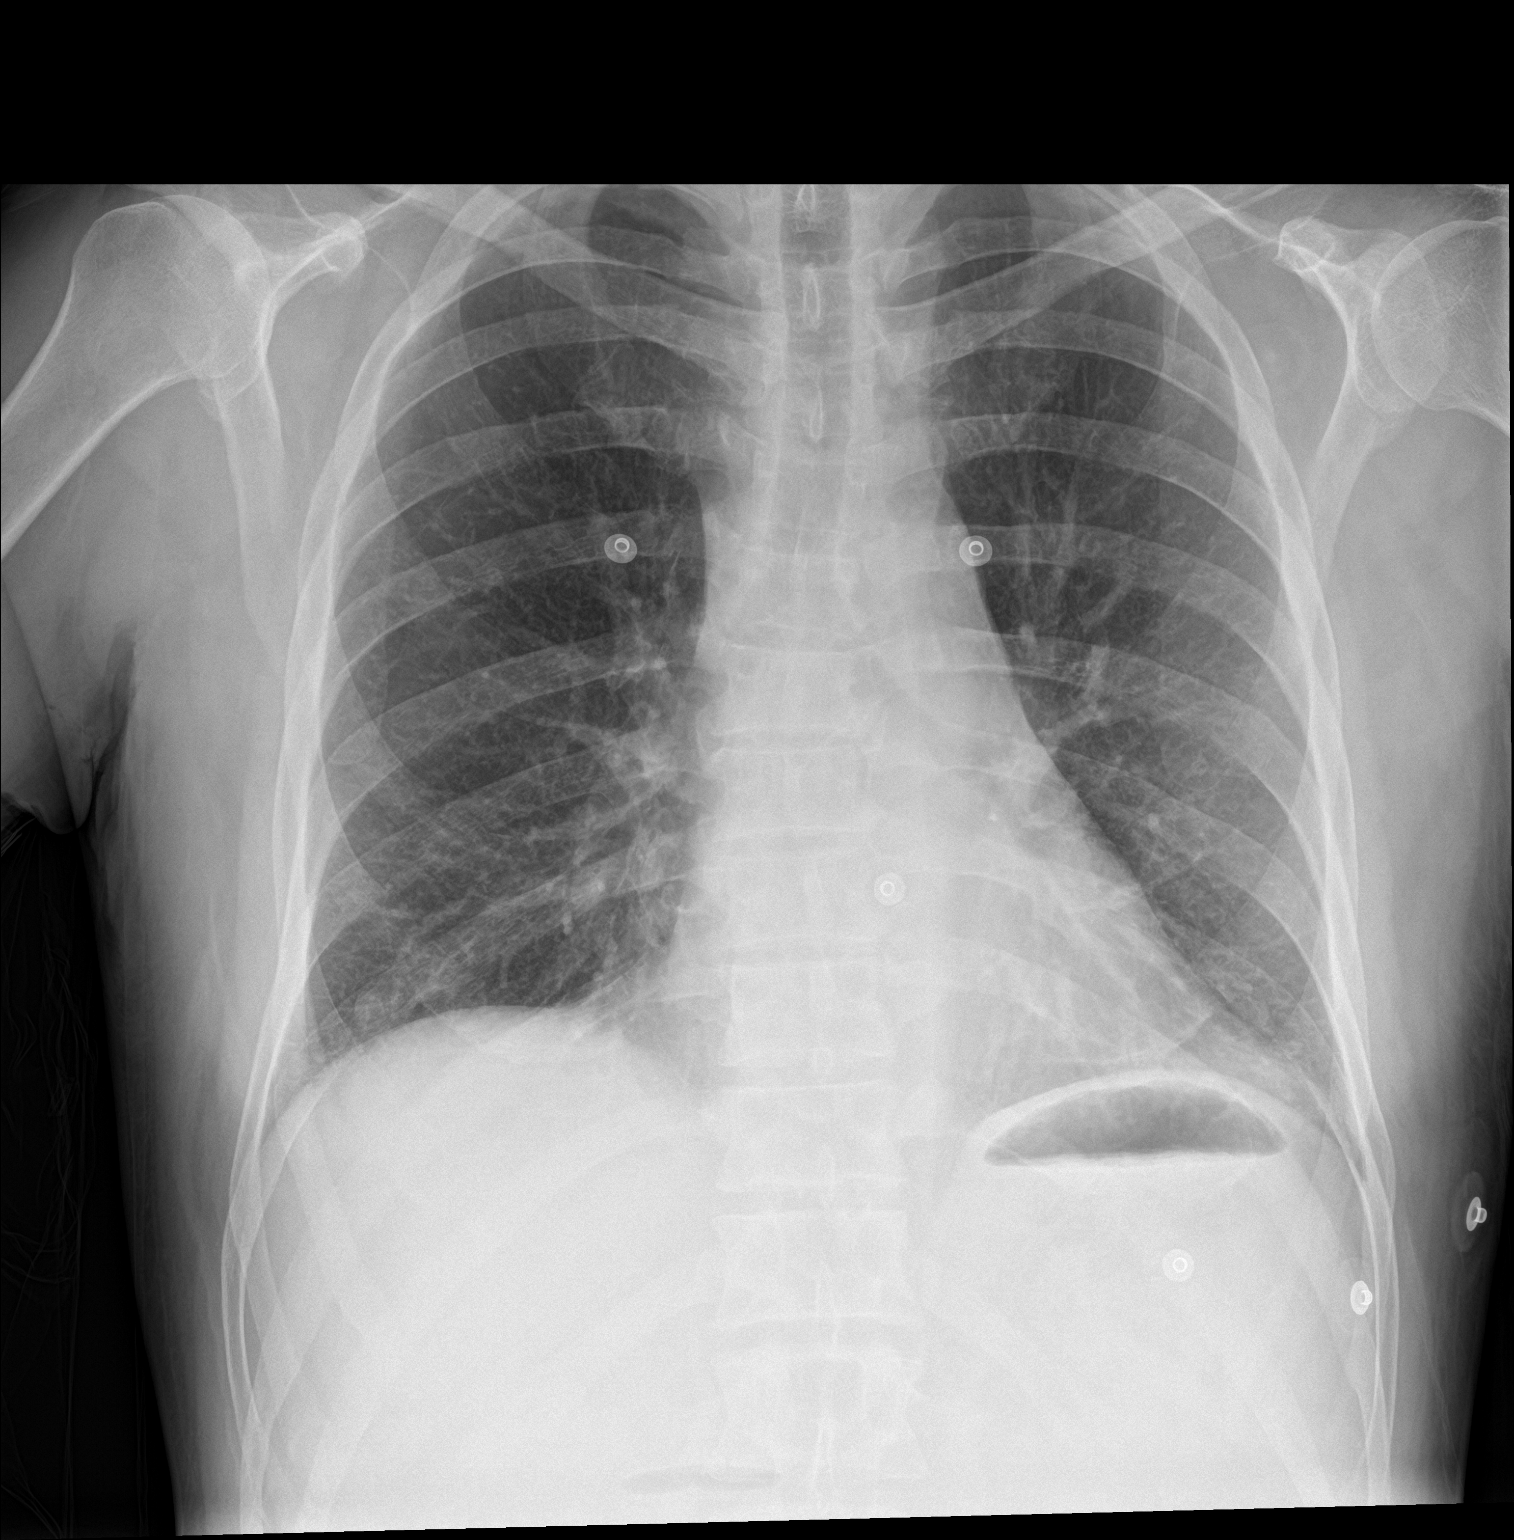

[chest lat]
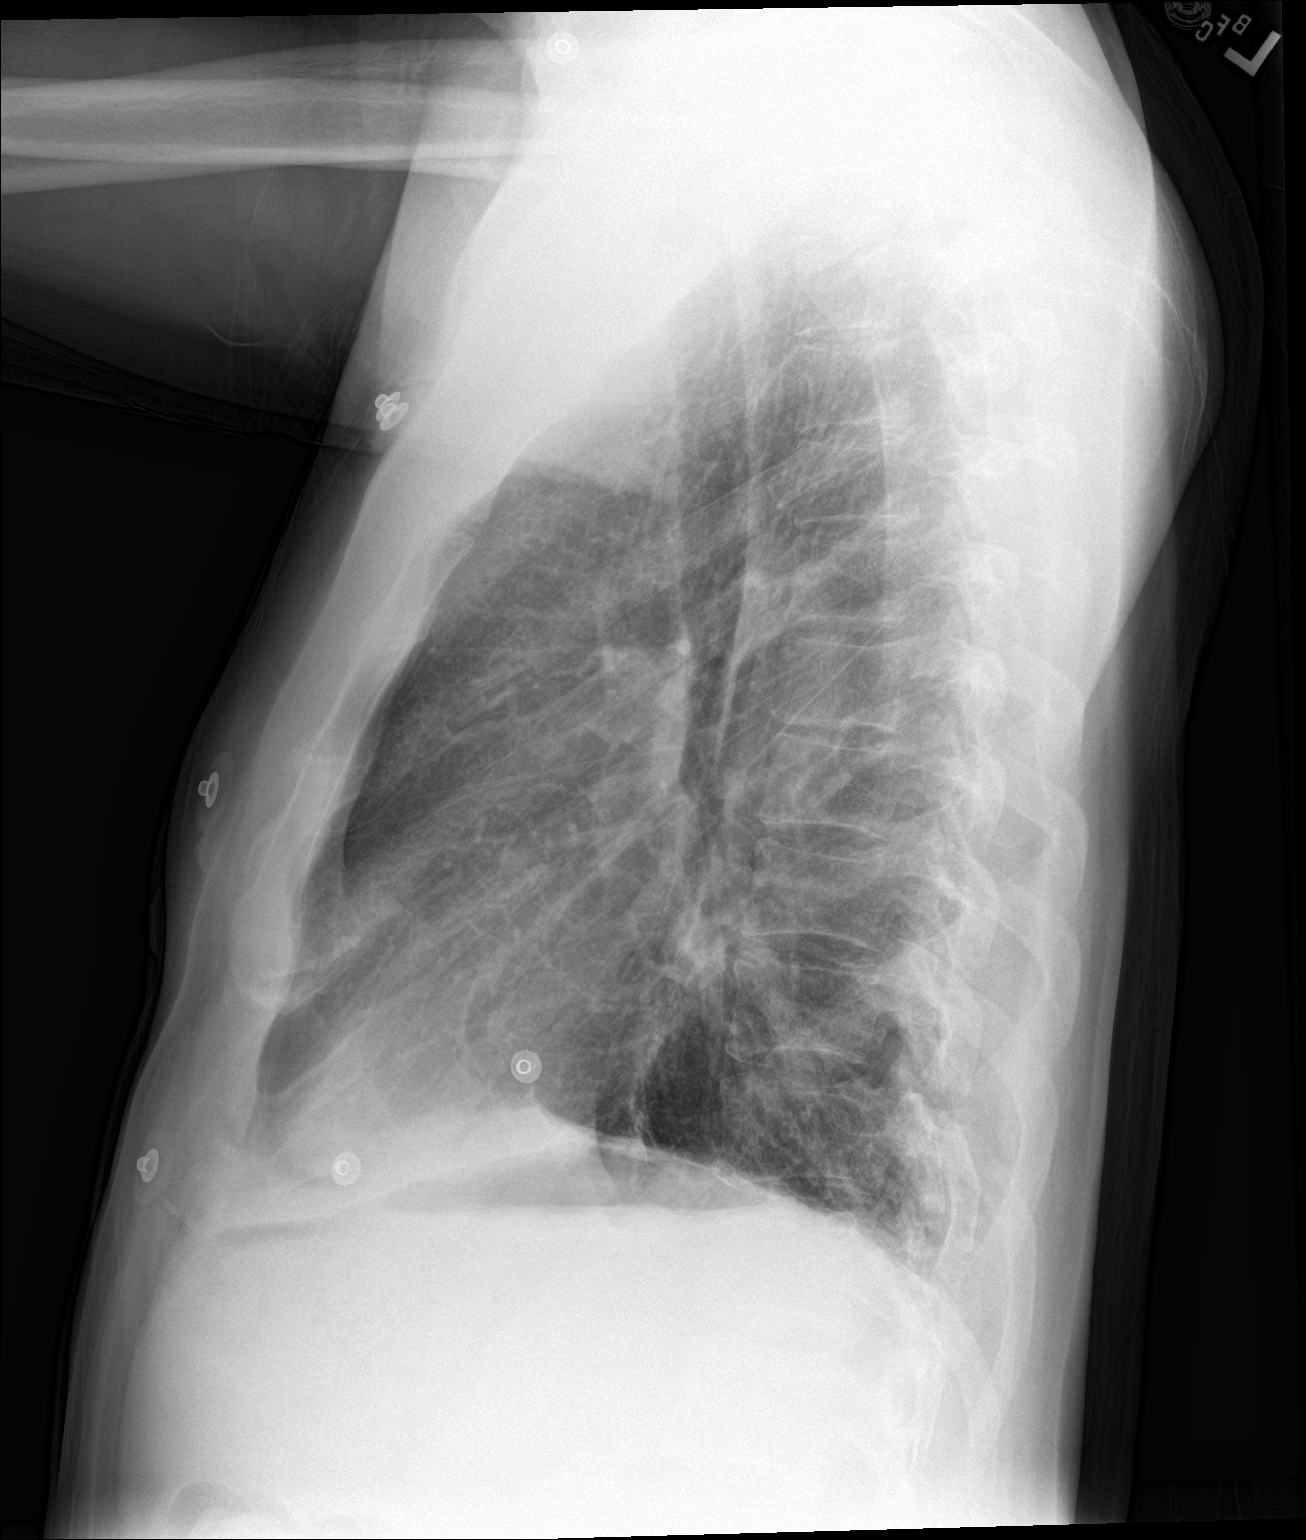

[2 of 2 positions shown; findings below may reference images not displayed]

FINDINGS: The lungs are well-aerated. Mild left basilar atelectasis is noted.
There is no evidence of pleural effusion or pneumothorax.

The heart is borderline normal in size. No acute osseous
abnormalities are seen.
IMPRESSION: Mild left basilar atelectasis noted.  Lungs otherwise grossly clear.

## 2017-04-06 ENCOUNTER — Ambulatory Visit: Payer: Medicaid Other | Attending: Family Medicine | Admitting: Family Medicine

## 2017-04-06 ENCOUNTER — Encounter: Payer: Self-pay | Admitting: Family Medicine

## 2017-04-06 VITALS — BP 104/64 | HR 82 | Temp 97.6°F | Ht 69.0 in | Wt 208.4 lb

## 2017-04-06 DIAGNOSIS — F332 Major depressive disorder, recurrent severe without psychotic features: Secondary | ICD-10-CM

## 2017-04-06 DIAGNOSIS — F321 Major depressive disorder, single episode, moderate: Secondary | ICD-10-CM | POA: Insufficient documentation

## 2017-04-06 DIAGNOSIS — Z0000 Encounter for general adult medical examination without abnormal findings: Secondary | ICD-10-CM

## 2017-04-06 DIAGNOSIS — M5137 Other intervertebral disc degeneration, lumbosacral region: Secondary | ICD-10-CM | POA: Diagnosis not present

## 2017-04-06 DIAGNOSIS — E7800 Pure hypercholesterolemia, unspecified: Secondary | ICD-10-CM | POA: Insufficient documentation

## 2017-04-06 DIAGNOSIS — F419 Anxiety disorder, unspecified: Secondary | ICD-10-CM | POA: Insufficient documentation

## 2017-04-06 DIAGNOSIS — Z79899 Other long term (current) drug therapy: Secondary | ICD-10-CM | POA: Insufficient documentation

## 2017-04-06 MED ORDER — TRAZODONE HCL 150 MG PO TABS: 150.0000 mg | ORAL_TABLET | Freq: Every evening | ORAL | 3 refills | Status: DC | PRN

## 2017-04-06 MED ORDER — HYDROXYZINE HCL 25 MG PO TABS: 25.0000 mg | ORAL_TABLET | Freq: Four times a day (QID) | ORAL | 3 refills | Status: DC | PRN

## 2017-04-06 MED ORDER — DULOXETINE HCL 60 MG PO CPEP: 60.0000 mg | ORAL_CAPSULE | Freq: Every day | ORAL | 3 refills | Status: DC

## 2017-04-06 MED ORDER — CYCLOBENZAPRINE HCL 10 MG PO TABS: 10.0000 mg | ORAL_TABLET | Freq: Every evening | ORAL | 3 refills | Status: DC | PRN

## 2017-04-06 MED ORDER — BUSPIRONE HCL 10 MG PO TABS: 10.0000 mg | ORAL_TABLET | Freq: Three times a day (TID) | ORAL | 2 refills | Status: DC

## 2017-04-06 MED ORDER — PRAVASTATIN SODIUM 40 MG PO TABS: 40.0000 mg | ORAL_TABLET | Freq: Every day | ORAL | 3 refills | Status: DC

## 2017-04-06 NOTE — Assessment & Plan Note (Signed)
A: chronic low back pain with stiffness P:  Refilled prn flexeril

## 2017-04-06 NOTE — Assessment & Plan Note (Signed)
A: depression and anxiety with cocaine and alcohol abuse history in remission P: Continue current regimen Buspar 10 mg BID, Cymbalta 60 mg daily, hydroxyzine 25 mg  times daily, and trazodone 150 mg nightly.

## 2017-04-06 NOTE — Progress Notes (Signed)
Subjective:  Patient ID: Marvin Clark, male    DOB: 10-14-1955  Age: 61 y.o. MRN: [id number]  CC: Depression   HPI Marvin Clark presents for   1. Depression and anxiety: he was hospitalized from 1/4-09/12/2016 at behavioral health from substance abuse mood disorder. His flexeril and tramadol were discontinued. He was [place] [facility] [place]  for a 14 day treatment. He reports mood is doing well. He is working. He is taking Buspar 10 mg BID, Cymbalta 60 mg daily, hydroxyzine 25 mg  times daily, and trazodone 150 mg nightly.   2. 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 He has been at his current job for 7 weeks. 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 He take flexeril 10 mg at night as needed for low back pain and stiffness.   Social History  Substance Use Topics  . Smoking status: Never Smoker  . Smokeless tobacco: Never Used  . Alcohol use No    Outpatient Medications Prior to Visit  Medication Sig Dispense Refill  . busPIRone (BUSPAR) 10 MG tablet Take 1 tablet (10 mg total) by mouth 3 (three) times daily. 90 tablet 5  . cyclobenzaprine (FLEXERIL) 10 MG tablet Take 1 tablet (10 mg total) by mouth at bedtime as needed for muscle spasms. 30 tablet 5  . DULoxetine (CYMBALTA) 60 MG capsule Take 1 capsule (60 mg total) by mouth daily. 30 capsule 5  . hydrOXYzine (ATARAX/VISTARIL) 25 MG tablet Take 1 tablet (25 mg total) by mouth every 6 (six) hours as needed for anxiety. 60 tablet 5  . pravastatin (PRAVACHOL) 40 MG tablet Take 1 tablet (40 mg total) by mouth daily. 30 tablet 5  . traZODone (DESYREL) 150 MG tablet Take 1 tablet (150 mg total) by mouth at bedtime as needed for sleep. 30 tablet 5   No facility-administered medications prior to visit.     ROS Review of Systems  Constitutional: Negative for chills, fatigue, fever and unexpected weight change.  Eyes: Negative for visual disturbance.  Respiratory: Negative for cough and  shortness of breath.   Cardiovascular: Negative for chest pain, palpitations and leg swelling.  Gastrointestinal: Negative for abdominal pain, blood in stool, constipation, diarrhea, nausea and vomiting.  Endocrine: Negative for polydipsia, polyphagia and polyuria.  Musculoskeletal: Positive for back pain. Negative for arthralgias, gait problem, myalgias and neck pain.  Skin: Negative for rash.  Allergic/Immunologic: Negative for immunocompromised state.  Hematological: Negative for adenopathy. Does not bruise/bleed easily.  Psychiatric/Behavioral: Negative for dysphoric mood, sleep disturbance and suicidal ideas. The patient is not nervous/anxious.     Objective:  BP 104/64   Pulse 82   Temp 97.6 F (36.4 C) (Oral)   Ht 5\' 9"  (1.753 m)   Wt 208 lb 6.4 oz (94.5 kg)   SpO2 94%   BMI 30.78 kg/m   BP/Weight 04/06/2017 12/22/2016 10/09/2016  Systolic BP 104 107 113  Diastolic BP 64 69 73  Wt. (Lbs) 208.4 198.4 188.4  BMI 30.78 29.3 27.82  Some encounter information is confidential and restricted. Go to Review Flowsheets activity to see all data.   BP Readings from Last 3 Encounters:  04/06/17 104/64  12/22/16 107/69  10/09/16 113/73   Wt Readings from Last 3 Encounters:  04/06/17 208 lb 6.4 oz (94.5 kg)  12/22/16 198 lb 6.4 oz (90 kg)  10/09/16 188 lb 6.4 oz (85.5 kg)    Physical Exam  Constitutional: He appears well-developed and well-nourished. No distress.  HENT:  Head: Normocephalic and atraumatic.  Neck: Normal range of motion. Neck supple.  Cardiovascular: Normal rate, regular rhythm, normal heart sounds and intact distal pulses.   Pulmonary/Chest: Effort normal and breath sounds normal.  Musculoskeletal: He exhibits no edema.  Neurological: He is alert.  Skin: Skin is warm and dry. No rash noted. No erythema.  Psychiatric: He has a normal mood and affect.   Depression screen [facility] 2/9 04/06/2017 12/22/2016 10/09/2016  Decreased Interest 0 1 1  Down, Depressed, Hopeless 0  1 1  PHQ - 2 Score 0 2 2  Altered sleeping 1 2 1   Tired, decreased energy 1 1 1   Change in appetite 2 1 0  Feeling bad or failure about yourself  0 0 1  Trouble concentrating 0 0 0  Moving slowly or fidgety/restless 0 0 0  Suicidal thoughts 0 0 0  PHQ-9 Score 4 6 5    GAD 7 : Generalized Anxiety Score 04/06/2017 12/22/2016 10/09/2016 06/23/2016  Nervous, Anxious, on Edge 1 1 2  0  Control/stop worrying 0 0 1 1  Worry too much - different things 1 0 2 1  Trouble relaxing 1 1 2 1   Restless 0 0 1 0  Easily annoyed or irritable 0 0 1 1  Afraid - awful might happen 0 0 0 0  Total GAD 7 Score 3 2 9 4      Assessment & Plan:  Marvin Clark was seen today for depression.  Diagnoses and all orders for this visit:  Healthcare maintenance -     Ambulatory referral to Gastroenterology  DISC DISEASE, LUMBAR -     cyclobenzaprine (FLEXERIL) 10 MG tablet; Take 1 tablet (10 mg total) by mouth at bedtime as needed for muscle spasms.  High cholesterol -     pravastatin (PRAVACHOL) 40 MG tablet; Take 1 tablet (40 mg total) by mouth daily.  Moderate single current episode of major depressive disorder (HCC) -     busPIRone (BUSPAR) 10 MG tablet; Take 1 tablet (10 mg total) by mouth 3 (three) times daily. -     DULoxetine (CYMBALTA) 60 MG capsule; Take 1 capsule (60 mg total) by mouth daily. -     hydrOXYzine (ATARAX/VISTARIL) 25 MG tablet; Take 1 tablet (25 mg total) by mouth every 6 (six) hours as needed for anxiety. -     traZODone (DESYREL) 150 MG tablet; Take 1 tablet (150 mg total) by mouth at bedtime as needed for sleep.   There are no diagnoses linked to this encounter.  No orders of the defined types were placed in this encounter.   Follow-up: Return in about 3 months (around 07/07/2017) for depression and back .   Dessa PhiJosalyn Myesha Stillion MD

## 2017-04-06 NOTE — Patient Instructions (Addendum)
Marvin Clark was seen today for depression.  Diagnoses and all orders for this visit:  Healthcare maintenance -     Ambulatory referral to Gastroenterology  DISC DISEASE, LUMBAR -     cyclobenzaprine (FLEXERIL) 10 MG tablet; Take 1 tablet (10 mg total) by mouth at bedtime as needed for muscle spasms.  High cholesterol -     pravastatin (PRAVACHOL) 40 MG tablet; Take 1 tablet (40 mg total) by mouth daily.  Moderate single current episode of major depressive disorder (HCC) -     busPIRone (BUSPAR) 10 MG tablet; Take 1 tablet (10 mg total) by mouth 3 (three) times daily. -     DULoxetine (CYMBALTA) 60 MG capsule; Take 1 capsule (60 mg total) by mouth daily. -     hydrOXYzine (ATARAX/VISTARIL) 25 MG tablet; Take 1 tablet (25 mg total) by mouth every 6 (six) hours as needed for anxiety. -     traZODone (DESYREL) 150 MG tablet; Take 1 tablet (150 mg total) by mouth at bedtime as needed for sleep.   F/u in 3 months for back pain and depression  Dr. Armen PickupFunches

## 2017-05-11 ENCOUNTER — Encounter: Payer: Self-pay | Admitting: Family Medicine

## 2017-07-09 ENCOUNTER — Ambulatory Visit: Payer: Self-pay | Admitting: Internal Medicine

## 2017-08-06 ENCOUNTER — Encounter: Payer: Self-pay | Admitting: Internal Medicine

## 2017-08-06 ENCOUNTER — Ambulatory Visit: Payer: Medicaid Other | Attending: Internal Medicine | Admitting: Internal Medicine

## 2017-08-06 VITALS — BP 94/61 | HR 86 | Temp 98.1°F | Resp 18 | Ht 70.0 in | Wt 208.0 lb

## 2017-08-06 DIAGNOSIS — Z2821 Immunization not carried out because of patient refusal: Secondary | ICD-10-CM

## 2017-08-06 DIAGNOSIS — E785 Hyperlipidemia, unspecified: Secondary | ICD-10-CM

## 2017-08-06 DIAGNOSIS — Z6829 Body mass index (BMI) 29.0-29.9, adult: Secondary | ICD-10-CM | POA: Insufficient documentation

## 2017-08-06 DIAGNOSIS — Z125 Encounter for screening for malignant neoplasm of prostate: Secondary | ICD-10-CM

## 2017-08-06 DIAGNOSIS — Z79899 Other long term (current) drug therapy: Secondary | ICD-10-CM | POA: Diagnosis not present

## 2017-08-06 DIAGNOSIS — M545 Low back pain: Secondary | ICD-10-CM | POA: Diagnosis not present

## 2017-08-06 DIAGNOSIS — Z1211 Encounter for screening for malignant neoplasm of colon: Secondary | ICD-10-CM | POA: Diagnosis not present

## 2017-08-06 DIAGNOSIS — G8929 Other chronic pain: Secondary | ICD-10-CM | POA: Diagnosis not present

## 2017-08-06 DIAGNOSIS — E663 Overweight: Secondary | ICD-10-CM | POA: Diagnosis not present

## 2017-08-06 DIAGNOSIS — F419 Anxiety disorder, unspecified: Secondary | ICD-10-CM | POA: Diagnosis not present

## 2017-08-06 DIAGNOSIS — F329 Major depressive disorder, single episode, unspecified: Secondary | ICD-10-CM | POA: Diagnosis not present

## 2017-08-06 DIAGNOSIS — Z825 Family history of asthma and other chronic lower respiratory diseases: Secondary | ICD-10-CM | POA: Diagnosis not present

## 2017-08-06 DIAGNOSIS — F32A Depression, unspecified: Secondary | ICD-10-CM

## 2017-08-06 NOTE — Progress Notes (Signed)
Patient ID: Ronda Fairlyicholas J Swiss, male    DOB: [birth year]  MRN: [id number]  CC: Establish Care   Subjective: Marvin Clark is a 61 y.o. male who presents for chronic ds management.  Patient last saw Dr. Armen PickupFunches is 04/2017. 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   1. Anx/dep:  Spells of depression but overall doing well -compliant with Buspar, Cymbalta and Trazodone -does not have therapist but goes to AA 3-4 x a wk  2. HL: tolerating Pravachol -not very active at home. Does a lot of walking at work. -drinks a lot of soda, eats ice cream every other night, and some fast foods.   3. Polysub: clean of coc since 09/2016 and ETOH for several yr  HM: due for flu shot but does not want one. Due for colonoscopy. No blood in stools. No Fx of colon CA Patient Active Problem List   Diagnosis Date Noted  . Major depressive disorder without psychotic features 09/07/2016  . BPH (benign prostatic hyperplasia) 09/07/2014  . CERVICAL MUSCLE STRAIN 08/23/2010  . High cholesterol 07/08/2007  . EXTERNAL HEMORRHOIDS 07/08/2007  . DIVERTICULOSIS, COLON 07/08/2007  . DISC DISEASE, LUMBAR 07/08/2007     Current Outpatient Medications on File Prior to Visit  Medication Sig Dispense Refill  . busPIRone (BUSPAR) 10 MG tablet Take 1 tablet (10 mg total) by mouth 3 (three) times daily. 270 tablet 2  . cyclobenzaprine (FLEXERIL) 10 MG tablet Take 1 tablet (10 mg total) by mouth at bedtime as needed for muscle spasms. 90 tablet 3  . DULoxetine (CYMBALTA) 60 MG capsule Take 1 capsule (60 mg total) by mouth daily. 90 capsule 3  . hydrOXYzine (ATARAX/VISTARIL) 25 MG tablet Take 1 tablet (25 mg total) by mouth every 6 (six) hours as needed for anxiety. 180 tablet 3  . pravastatin (PRAVACHOL) 40 MG tablet Take 1 tablet (40 mg total) by mouth daily. 90 tablet 3  . traZODone (DESYREL) 150 MG tablet Take  1 tablet (150 mg total) by mouth at bedtime as needed for sleep. 90 tablet 3   No current facility-administered medications on file prior to visit.     No Known Allergies  Social History   Socioeconomic History  . Marital status: Divorced    Spouse name: Not on file  . Number of children: Not on file  . Years of education: Not on file  . Highest education level: Not on file  Social Needs  . Financial resource strain: Not on file  . Food insecurity - worry: Not on file  . Food insecurity - inability: Not on file  . Transportation needs - medical: Not on file  . Transportation needs - non-medical: Not on file  Occupational History  . Not on file  Tobacco Use  . Smoking status: Never Smoker  . Smokeless tobacco: Never Used  Substance and Sexual Activity  . Alcohol use: No  . Drug use: No  . Sexual activity: Yes  Other Topics Concern  . Not on file  Social History Narrative  . Not on file    Family History  Problem Relation Age of Onset  . COPD Mother   . Cancer Father     Past Surgical History:  Procedure Laterality Date  . LUMBAR DISC SURGERY  1997     ROS: Review of Systems  Constitutional: Negative for activity change and appetite change.  Respiratory: Negative for shortness of breath.  Cardiovascular: Negative for chest pain.  Genitourinary: Negative for difficulty urinating.       Some postvoid dribbling. Gets up about once at nights to urinate.    PHYSICAL EXAM: BP 94/61 (BP Location: Left Arm, Patient Position: Sitting, Cuff Size: Normal)   Pulse 86   Temp 98.1 F (36.7 C) (Oral)   Resp 18   Ht 5\' 10"  (1.778 m)   Wt 208 lb (94.3 kg)   SpO2 97%   BMI 29.84 kg/m   Wt Readings from Last 3 Encounters:  08/06/17 208 lb (94.3 kg)  04/06/17 208 lb 6.4 oz (94.5 kg)  12/22/16 198 lb 6.4 oz (90 kg)    Physical Exam General appearance - alert, well appearing,  Caucasian male and in no distress Mental status - alert, oriented to person, place, and  time, normal mood, behavior, speech, dress, motor activity, and thought processes Eyes - pupils equal and reactive, extraocular eye movements intact Nose - normal and patent, no erythema, discharge or polyps Mouth - mucous membranes moist, pharynx normal without lesions Neck - supple, no significant adenopathy Chest - clear to auscultation, no wheezes, rales or rhonchi, symmetric air entry Heart - normal rate, regular rhythm, normal S1, S2, no murmurs, rubs, clicks or gallops Extremities - peripheral pulses normal, no pedal edema, no clubbing or cyanosis   ASSESSMENT AND PLAN: 1. Anxiety and depression -stable on Buspar, Cymbalta, and Trazodone  2. Hyperlipidemia, unspecified hyperlipidemia type Continue Pravachol - CBC - Lipid panel - Comprehensive metabolic panel  3. Influenza vaccination declined   4. Prostate cancer screening Pt agreeable to screening - PSA  5. Colon cancer screening - Ambulatory referral to Gastroenterology  6. Over weight Counseling given on healthy eating habits.  Discussed healthy food choices and snacks.  He will cut back on eating ice cream and will save it for the weekends.  He will try to drink more water rather than sodas  Patient was given the opportunity to ask questions.  Patient verbalized understanding of the plan and was able to repeat key elements of the plan.   Orders Placed This Encounter  Procedures  . CBC  . Lipid panel  . Comprehensive metabolic panel  . PSA  . Ambulatory referral to Gastroenterology     Requested Prescriptions    No prescriptions requested or ordered in this encounter    Return in about 3 months (around 11/04/2017).  Jonah Blueeborah Raynard Mapps, MD, FACP

## 2017-08-06 NOTE — Patient Instructions (Signed)
Cut back on snacking on junk foods. Try to snack on fruits and veggies. Cut back on drinking sodas. Drink more water.  Follow a Healthy Eating Plan - You can do it! Limit sugary drinks.  Avoid sodas, sweet tea, sport or energy drinks, or fruit drinks.  Drink water, lo-fat milk, or diet drinks. Limit snack foods.   Cut back on candy, cake, cookies, chips, ice cream.  These are a special treat, only in small amounts. Eat plenty of vegetables.  Especially dark green, red, and orange vegetables. Aim for at least 3 servings a day. More is better! Include fruit in your daily diet.  Whole fruit is much healthier than fruit juice! Limit "white" bread, "white" pasta, "white" rice.   Choose "100% whole grain" products, brown or wild rice. Avoid fatty meats. Try "Meatless Monday" and choose eggs or beans one day a week.  When eating meat, choose lean meats like chicken, [place], and fish.  Grill, broil, or bake meats instead of frying, and eat poultry without the skin. Eat less salt.  Avoid frozen pizzas, frozen dinners and salty foods.  Use seasonings other than salt in cooking.  This can help blood pressure and keep you from swelling Beer, wine and liquor have calories.  If you can safely drink alcohol, limit to 1 drink per day for women, 2 drinks for men

## 2017-08-07 LAB — CBC
Hematocrit: 51.2 % — ABNORMAL HIGH (ref 37.5–51.0)
Hemoglobin: 17.8 g/dL — ABNORMAL HIGH (ref 13.0–17.7)
MCH: 30 pg (ref 26.6–33.0)
MCHC: 34.8 g/dL (ref 31.5–35.7)
MCV: 86 fL (ref 79–97)
PLATELETS: 246 10*3/uL (ref 150–379)
RBC: 5.93 x10E6/uL — ABNORMAL HIGH (ref 4.14–5.80)
RDW: 13.9 % (ref 12.3–15.4)
WBC: 8.3 10*3/uL (ref 3.4–10.8)

## 2017-08-07 LAB — COMPREHENSIVE METABOLIC PANEL
ALBUMIN: 3.9 g/dL (ref 3.6–4.8)
ALT: 11 IU/L (ref 0–44)
AST: 16 IU/L (ref 0–40)
Albumin/Globulin Ratio: 2 (ref 1.2–2.2)
Alkaline Phosphatase: 68 IU/L (ref 39–117)
BUN/Creatinine Ratio: 10 (ref 10–24)
BUN: 9 mg/dL (ref 8–27)
Bilirubin Total: <0.2 mg/dL (ref 0.0–1.2)
CO2: 25 mmol/L (ref 20–29)
CREATININE: 0.87 mg/dL (ref 0.76–1.27)
Calcium: 9.3 mg/dL (ref 8.6–10.2)
Chloride: 101 mmol/L (ref 96–106)
GFR, EST AFRICAN AMERICAN: 108 mL/min/{1.73_m2} (ref >59)
GFR, EST NON AFRICAN AMERICAN: 94 mL/min/{1.73_m2} (ref >59)
GLUCOSE: 96 mg/dL (ref 65–99)
Globulin, Total: 2 g/dL (ref 1.5–4.5)
Potassium: 4.9 mmol/L (ref 3.5–5.2)
Sodium: 141 mmol/L (ref 134–144)
TOTAL PROTEIN: 5.9 g/dL — AB (ref 6.0–8.5)

## 2017-08-07 LAB — LIPID PANEL
CHOLESTEROL TOTAL: 175 mg/dL (ref 100–199)
Chol/HDL Ratio: 4.5 ratio (ref 0.0–5.0)
HDL: 39 mg/dL — ABNORMAL LOW (ref >39)
LDL Calculated: 80 mg/dL (ref 0–99)
Triglycerides: 280 mg/dL — ABNORMAL HIGH (ref 0–149)
VLDL Cholesterol Cal: 56 mg/dL — ABNORMAL HIGH (ref 5–40)

## 2017-08-07 LAB — PSA: Prostate Specific Ag, Serum: 4.5 ng/mL — ABNORMAL HIGH (ref 0.0–4.0)

## 2017-08-08 ENCOUNTER — Telehealth: Payer: Self-pay | Admitting: Internal Medicine

## 2017-08-08 NOTE — Telephone Encounter (Signed)
Pt called back returning the PCP called for lab result, please follow up

## 2017-08-08 NOTE — Telephone Encounter (Signed)
PC placed to pt today to discuss lab results. I left message on his VM asking him to call and ask to speak with my nurse to get lab results. For CMA: let pt know that his kidney and LFT are normal. Elevation in triglyceride on cholesterol panel. Take the Pravachol as prescribed and try to make dietary changes as was discussed on recent office visit. His PSA was mildly elevated at 4.5 with normal level being 0.0 - 4.0. We can recheck in 6 mths. If still elevated, will refer to urologist.

## 2017-08-09 NOTE — Telephone Encounter (Signed)
Patient call asking for labs and I informed him to take the medication as prescribed and that the doctor said that they would recheck is PSA leves in 6 months.  I informed that kidney and LFT are normal. Also that the triglyceride and cholesterol was elevated.

## 2017-09-12 ENCOUNTER — Encounter: Payer: Self-pay | Admitting: Internal Medicine

## 2017-11-08 ENCOUNTER — Ambulatory Visit: Payer: Self-pay | Admitting: Internal Medicine

## 2017-11-19 ENCOUNTER — Encounter: Payer: Self-pay | Admitting: Internal Medicine

## 2017-11-19 ENCOUNTER — Ambulatory Visit: Payer: Medicaid Other | Attending: Internal Medicine | Admitting: Internal Medicine

## 2017-11-19 VITALS — BP 103/69 | HR 72 | Temp 97.6°F | Resp 16 | Ht 69.0 in | Wt 212.0 lb

## 2017-11-19 DIAGNOSIS — Z809 Family history of malignant neoplasm, unspecified: Secondary | ICD-10-CM | POA: Insufficient documentation

## 2017-11-19 DIAGNOSIS — M5431 Sciatica, right side: Secondary | ICD-10-CM

## 2017-11-19 DIAGNOSIS — F329 Major depressive disorder, single episode, unspecified: Secondary | ICD-10-CM | POA: Diagnosis not present

## 2017-11-19 DIAGNOSIS — R972 Elevated prostate specific antigen [PSA]: Secondary | ICD-10-CM | POA: Diagnosis not present

## 2017-11-19 DIAGNOSIS — Z9889 Other specified postprocedural states: Secondary | ICD-10-CM | POA: Insufficient documentation

## 2017-11-19 DIAGNOSIS — F1411 Cocaine abuse, in remission: Secondary | ICD-10-CM | POA: Insufficient documentation

## 2017-11-19 DIAGNOSIS — F419 Anxiety disorder, unspecified: Secondary | ICD-10-CM | POA: Diagnosis not present

## 2017-11-19 DIAGNOSIS — F32A Depression, unspecified: Secondary | ICD-10-CM

## 2017-11-19 DIAGNOSIS — E785 Hyperlipidemia, unspecified: Secondary | ICD-10-CM | POA: Insufficient documentation

## 2017-11-19 DIAGNOSIS — N400 Enlarged prostate without lower urinary tract symptoms: Secondary | ICD-10-CM | POA: Insufficient documentation

## 2017-11-19 DIAGNOSIS — Z836 Family history of other diseases of the respiratory system: Secondary | ICD-10-CM | POA: Diagnosis not present

## 2017-11-19 DIAGNOSIS — Z79899 Other long term (current) drug therapy: Secondary | ICD-10-CM | POA: Diagnosis not present

## 2017-11-19 DIAGNOSIS — H9201 Otalgia, right ear: Secondary | ICD-10-CM | POA: Insufficient documentation

## 2017-11-19 DIAGNOSIS — F1011 Alcohol abuse, in remission: Secondary | ICD-10-CM | POA: Diagnosis not present

## 2017-11-19 DIAGNOSIS — M5441 Lumbago with sciatica, right side: Secondary | ICD-10-CM | POA: Insufficient documentation

## 2017-11-19 MED ORDER — ACETAMINOPHEN-CODEINE 300-30 MG PO TABS: 1.0000 | ORAL_TABLET | Freq: Four times a day (QID) | ORAL | 0 refills | Status: DC | PRN

## 2017-11-19 MED ORDER — PREDNISONE 20 MG PO TABS: 20.0000 mg | ORAL_TABLET | Freq: Every day | ORAL | 0 refills | Status: DC

## 2017-11-19 NOTE — Patient Instructions (Signed)
Use the Flexeril with the Tylenol #3 and Prednisone as needed.   Sciatica Sciatica is pain, numbness, weakness, or tingling along your sciatic nerve. The sciatic nerve starts in the lower back and goes down the back of each leg. Sciatica happens when this nerve is pinched or has pressure put on it. Sciatica usually goes away on its own or with treatment. Sometimes, sciatica may keep coming back (recur). Follow these instructions at home: Medicines  Take over-the-counter and prescription medicines only as told by your doctor.  Do not drive or use heavy machinery while taking prescription pain medicine. Managing pain  If directed, put ice on the affected area. ? Put ice in a plastic bag. ? Place a towel between your skin and the bag. ? Leave the ice on for 20 minutes, 2-3 times a day.  After icing, apply heat to the affected area before you exercise or as often as told by your doctor. Use the heat source that your doctor tells you to use, such as a moist heat pack or a heating pad. ? Place a towel between your skin and the heat source. ? Leave the heat on for 20-30 minutes. ? Remove the heat if your skin turns bright red. This is especially important if you are unable to feel pain, heat, or cold. You may have a greater risk of getting burned. Activity  Return to your normal activities as told by your doctor. Ask your doctor what activities are safe for you. ? Avoid activities that make your sciatica worse.  Take short rests during the day. Rest in a lying or standing position. This is usually better than sitting to rest. ? When you rest for a long time, do some physical activity or stretching between periods of rest. ? Avoid sitting for a long time without moving. Get up and move around at least one time each hour.  Exercise and stretch regularly, as told by your doctor.  Do not lift anything that is heavier than 10 lb (4.5 kg) while you have symptoms of sciatica. ? Avoid lifting  heavy things even when you do not have symptoms. ? Avoid lifting heavy things over and over.  When you lift objects, always lift in a way that is safe for your body. To do this, you should: ? Bend your knees. ? Keep the object close to your body. ? Avoid twisting. General instructions  Use good posture. ? Avoid leaning forward when you are sitting. ? Avoid hunching over when you are standing.  Stay at a healthy weight.  Wear comfortable shoes that support your feet. Avoid wearing high heels.  Avoid sleeping on a mattress that is too soft or too hard. You might have less pain if you sleep on a mattress that is firm enough to support your back.  Keep all follow-up visits as told by your doctor. This is important. Contact a doctor if:  You have pain that: ? Wakes you up when you are sleeping. ? Gets worse when you lie down. ? Is worse than the pain you have had in the past. ? Lasts longer than 4 weeks.  You lose weight for without trying. Get help right away if:  You cannot control when you pee (urinate) or poop (have a bowel movement).  You have weakness in any of these areas and it gets worse. ? Lower back. ? Lower belly (pelvis). ? Butt (buttocks). ? Legs.  You have redness or swelling of your back.  You have  a burning feeling when you pee. This information is not intended to replace advice given to you by your health care provider. Make sure you discuss any questions you have with your health care provider. Document Released: 05/30/2008 Document Revised: 01/27/2016 Document Reviewed: 04/30/2015 Elsevier Interactive Patient Education  [employer].

## 2017-11-19 NOTE — Progress Notes (Signed)
Patient ID: Ronda Fairlyicholas J Grunder, male    DOB: 1955-12-28  MRN: [id number]  CC: Follow-up   Subjective: Marvin Clark is a 62 y.o. male who presents for chronic disease management. 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   1.  LBP: helped his son move furniture last Thursday. Pain since then.  Muscles in the lower back feel tight and has some discomfort/pain radiating to the right leg to the level of the knee Flexeril not helping as much Had surgery on lower back 20 yrs ago for ruptured disc   2.  Dep/Anxiety:  Doing Okay. In b/w jobs with temp service.  Feeling a little stress until he is called for another job.  He is worried about how he would pay his bills otherwise.  3.  Substance abuse: clean x 3 yrs.  Still going to AA 2-3 x a wk.   4.  PSA mildly elev at 4.5 on last visit Brother has prostate CA. -He endorses go flow and feels like he completely empty each time.  Some post void dribbling occasionally  5.  Pain R ear: On and off at nights over the past 2 weeks.  No decreased hearing.  Patient Active Problem List   Diagnosis Date Noted  . Anxiety and depression 08/06/2017  . Over weight 08/06/2017  . Major depressive disorder without psychotic features 09/07/2016  . BPH (benign prostatic hyperplasia) 09/07/2014  . CERVICAL MUSCLE STRAIN 08/23/2010  . Hyperlipidemia 07/08/2007  . EXTERNAL HEMORRHOIDS 07/08/2007  . DIVERTICULOSIS, COLON 07/08/2007  . DISC DISEASE, LUMBAR 07/08/2007     Current Outpatient Medications on File Prior to Visit  Medication Sig Dispense Refill  . busPIRone (BUSPAR) 10 MG tablet Take 1 tablet (10 mg total) by mouth 3 (three) times daily. 270 tablet 2  . cyclobenzaprine (FLEXERIL) 10 MG tablet Take 1 tablet (10 mg total) by mouth at bedtime as needed for muscle spasms. 90 tablet 3  . DULoxetine (CYMBALTA) 60 MG capsule Take 1 capsule  (60 mg total) by mouth daily. 90 capsule 3  . hydrOXYzine (ATARAX/VISTARIL) 25 MG tablet Take 1 tablet (25 mg total) by mouth every 6 (six) hours as needed for anxiety. 180 tablet 3  . pravastatin (PRAVACHOL) 40 MG tablet Take 1 tablet (40 mg total) by mouth daily. 90 tablet 3  . traZODone (DESYREL) 150 MG tablet Take 1 tablet (150 mg total) by mouth at bedtime as needed for sleep. 90 tablet 3   No current facility-administered medications on file prior to visit.     No Known Allergies  Social History   Socioeconomic History  . Marital status: Divorced    Spouse name: Not on file  . Number of children: Not on file  . Years of education: Not on file  . Highest education level: Not on file  Social Needs  . Financial resource strain: Not on file  . Food insecurity - worry: Not on file  . Food insecurity - inability: Not on file  . Transportation needs - medical: Not on file  . Transportation needs - non-medical: Not on file  Occupational History  . Not on file  Tobacco Use  . Smoking status: Never Smoker  . Smokeless tobacco: Never Used  Substance and Sexual Activity  . Alcohol use: No  . Drug use: No  . Sexual activity: Yes  Other Topics Concern  . Not on file  Social History Narrative  .  Not on file    Family History  Problem Relation Age of Onset  . COPD Mother   . Cancer Father     Past Surgical History:  Procedure Laterality Date  . LUMBAR DISC SURGERY  1997     ROS: Review of Systems Negative except as stated above PHYSICAL EXAM: BP 103/69   Pulse 72   Temp 97.6 F (36.4 C) (Oral)   Resp 16   Ht 5\' 9"  (1.753 m)   Wt 212 lb (96.2 kg)   SpO2 96%   BMI 31.31 kg/m   Physical Exam   General appearance - alert, well appearing, Caucasian male and in no distress Mental status - alert, oriented to person, place, and time, normal mood, behavior, speech, dress, motor activity, and thought processes Ears: Both ear canals and membranes are within normal  limits Neck - supple, no significant adenopathy Chest - clear to auscultation, no wheezes, rales or rhonchi, symmetric air entry Heart - normal rate, regular rhythm, normal S1, S2, no murmurs, rubs, clicks or gallops Musculoskeletal -no tenderness on palpation of LS spine.  Power in lower extremities 5 out of 5 bilaterally.  Gross sensation intact.  Gait is normal Extremities -no lower extremity edema   ASSESSMENT AND PLAN: 1. Sciatica of right side Avoid any heavy lifting for the next 2 weeks. Continue Flexeril as needed. Given her short course of prednisone and Tylenol 3's to use as needed.  2. Anxiety and depression Controlled on Cymbalta, BuSpar and trazodone  3. Elevated PSA I went over lab results.  We will plan to repeat level on next visit  4. Right ear pain Reassurance given   Patient was given the opportunity to ask questions.  Patient verbalized understanding of the plan and was able to repeat key elements of the plan.   No orders of the defined types were placed in this encounter.    Requested Prescriptions   Signed Prescriptions Disp Refills  . predniSONE (DELTASONE) 20 MG tablet 5 tablet 0    Sig: Take 1 tablet (20 mg total) by mouth daily with breakfast.  . Acetaminophen-Codeine (TYLENOL/CODEINE #3) 300-30 MG tablet 15 tablet 0    Sig: Take 1 tablet by mouth every 6 (six) hours as needed for pain.    Return in about 3 months (around 02/19/2018).  Jonah Blue, MD, FACP

## 2018-02-21 ENCOUNTER — Other Ambulatory Visit: Payer: Self-pay | Admitting: Internal Medicine

## 2018-02-21 ENCOUNTER — Ambulatory Visit: Payer: Medicaid Other | Attending: Internal Medicine | Admitting: Internal Medicine

## 2018-02-21 ENCOUNTER — Encounter: Payer: Self-pay | Admitting: Internal Medicine

## 2018-02-21 VITALS — BP 106/67 | HR 81 | Temp 98.1°F | Resp 16 | Wt 200.2 lb

## 2018-02-21 DIAGNOSIS — E785 Hyperlipidemia, unspecified: Secondary | ICD-10-CM

## 2018-02-21 DIAGNOSIS — M545 Low back pain, unspecified: Secondary | ICD-10-CM

## 2018-02-21 DIAGNOSIS — K5730 Diverticulosis of large intestine without perforation or abscess without bleeding: Secondary | ICD-10-CM | POA: Diagnosis not present

## 2018-02-21 DIAGNOSIS — F329 Major depressive disorder, single episode, unspecified: Secondary | ICD-10-CM

## 2018-02-21 DIAGNOSIS — G8929 Other chronic pain: Secondary | ICD-10-CM

## 2018-02-21 DIAGNOSIS — F1911 Other psychoactive substance abuse, in remission: Secondary | ICD-10-CM | POA: Diagnosis not present

## 2018-02-21 DIAGNOSIS — Z1211 Encounter for screening for malignant neoplasm of colon: Secondary | ICD-10-CM | POA: Diagnosis present

## 2018-02-21 DIAGNOSIS — F419 Anxiety disorder, unspecified: Secondary | ICD-10-CM | POA: Diagnosis not present

## 2018-02-21 DIAGNOSIS — N400 Enlarged prostate without lower urinary tract symptoms: Secondary | ICD-10-CM | POA: Insufficient documentation

## 2018-02-21 MED ORDER — PRAVASTATIN SODIUM 40 MG PO TABS: 40.0000 mg | ORAL_TABLET | Freq: Every day | ORAL | 3 refills | Status: AC

## 2018-02-21 MED ORDER — HYDROXYZINE HCL 25 MG PO TABS: 25.0000 mg | ORAL_TABLET | Freq: Four times a day (QID) | ORAL | 3 refills | Status: AC | PRN

## 2018-02-21 MED ORDER — NAPROXEN 500 MG PO TABS: 500.0000 mg | ORAL_TABLET | Freq: Two times a day (BID) | ORAL | 1 refills | Status: AC

## 2018-02-21 MED ORDER — DULOXETINE HCL 60 MG PO CPEP: 60.0000 mg | ORAL_CAPSULE | Freq: Every day | ORAL | 3 refills | Status: AC

## 2018-02-21 MED ORDER — BUSPIRONE HCL 10 MG PO TABS: 10.0000 mg | ORAL_TABLET | Freq: Three times a day (TID) | ORAL | 2 refills | Status: AC

## 2018-02-21 MED ORDER — TRAZODONE HCL 150 MG PO TABS: 150.0000 mg | ORAL_TABLET | Freq: Every evening | ORAL | 3 refills | Status: AC | PRN

## 2018-02-21 MED ORDER — CYCLOBENZAPRINE HCL 10 MG PO TABS: 10.0000 mg | ORAL_TABLET | Freq: Every day | ORAL | 3 refills | Status: AC | PRN

## 2018-02-21 NOTE — Progress Notes (Signed)
Patient ID: Marvin Clark, male    DOB: 01/24/1956  MRN: [id number]  CC: Medication Refill   Subjective: Marvin Clark is a 62 y.o. male who presents for chronic ds management His concerns today include:  Patient with history of anxiety/depression, polysubstance abuse in remission(ETOH/cocaine),chronic lower back pain, HL.   On last visit patient had a flare of lower back pain due to moving furniture.  I had given him a limited supply of Tylenol 3's.  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  At the end of the day his back is very sore with a lot of muscle tightness.  He has history of back surgery over 20 years ago for ruptured disks.  He is requesting more Tylenol threes.  Anxiety/depression:  "I have my days." Working again has helped a lot in terms of decreasing stress about paying his bills.  He is compliant with medications.  HL: compliant with Pravachol  Eating better.  Loss 12 lbs since last visit  Seen in the ER through [facility] a little over a month ago for vomiting and significant diarrhea.  CAT scan of the abdomen revealed diverticulitis.  He was given IV fluids and discharged with oral antibiotics.  Symptoms have since resolved.  Health maintenance: Due for colonoscopy. Patient Active Problem List   Diagnosis Date Noted  . Anxiety and depression 08/06/2017  . Over weight 08/06/2017  . Major depressive disorder without psychotic features 09/07/2016  . BPH (benign prostatic hyperplasia) 09/07/2014  . CERVICAL MUSCLE STRAIN 08/23/2010  . Hyperlipidemia 07/08/2007  . EXTERNAL HEMORRHOIDS 07/08/2007  . DIVERTICULOSIS, COLON 07/08/2007  . DISC DISEASE, LUMBAR 07/08/2007     No current outpatient medications on file prior to visit.   No current facility-administered medications on file prior to visit.     No Known Allergies  Social History    Socioeconomic History  . Marital status: Divorced    Spouse name: Not on file  . Number of children: Not on file  . Years of education: Not on file  . Highest education level: Not on file  Occupational History  . Not on file  Social Needs  . Financial resource strain: Not on file  . Food insecurity:    Worry: Not on file    Inability: Not on file  . Transportation needs:    Medical: Not on file    Non-medical: Not on file  Tobacco Use  . Smoking status: Never Smoker  . Smokeless tobacco: Never Used  Substance and Sexual Activity  . Alcohol use: No  . Drug use: No  . Sexual activity: Yes  Lifestyle  . Physical activity:    Days per week: Not on file    Minutes per session: Not on file  . Stress: Not on file  Relationships  . Social connections:    Talks on phone: Not on file    Gets together: Not on file    Attends religious service: Not on file    Active member of club or organization: Not on file    Attends meetings of clubs or organizations: Not on file    Relationship status: Not on file  . Intimate partner violence:    Fear of current or ex partner: Not on file    Emotionally abused: Not on file    Physically abused: Not on file    Forced  sexual activity: Not on file  Other Topics Concern  . Not on file  Social History Narrative  . Not on file    Family History  Problem Relation Age of Onset  . COPD Mother   . Cancer Father     Past Surgical History:  Procedure Laterality Date  . LUMBAR DISC SURGERY  1997     ROS: Review of Systems Negative except as stated above  PHYSICAL EXAM: BP 106/67   Pulse 81   Temp 98.1 F (36.7 C) (Oral)   Resp 16   Wt 200 lb 3.2 oz (90.8 kg)   SpO2 95%   BMI 29.56 kg/m   Wt Readings from Last 3 Encounters:  02/21/18 200 lb 3.2 oz (90.8 kg)  11/19/17 212 lb (96.2 kg)  08/06/17 208 lb (94.3 kg)   Physical Exam  General appearance - alert, well appearing, older Caucasian male and in no distress Mental  status - normal mood, behavior, speech, dress, motor activity, and thought processes Mouth - mucous membranes moist, pharynx normal without lesions Neck - supple, no significant adenopathy Chest - clear to auscultation, no wheezes, rales or rhonchi, symmetric air entry Heart - normal rate, regular rhythm, normal S1, S2, no murmurs, rubs, clicks or gallops Musculoskeletal -mild tenderness on palpation of lumbar paraspinal muscles Extremities - peripheral pulses normal, no pedal edema, no clubbing or cyanosis  ASSESSMENT AND PLAN: 1. Hyperlipidemia, unspecified hyperlipidemia type - pravastatin (PRAVACHOL) 40 MG tablet; Take 1 tablet (40 mg total) by mouth daily.  Dispense: 90 tablet; Refill: 3  2. Anxiety and depression - DULoxetine (CYMBALTA) 60 MG capsule; Take 1 capsule (60 mg total) by mouth daily.  Dispense: 90 capsule; Refill: 3 - hydrOXYzine (ATARAX/VISTARIL) 25 MG tablet; Take 1 tablet (25 mg total) by mouth every 6 (six) hours as needed for anxiety.  Dispense: 180 tablet; Refill: 3 - traZODone (DESYREL) 150 MG tablet; Take 1 tablet (150 mg total) by mouth at bedtime as needed for sleep.  Dispense: 90 tablet; Refill: 3 - busPIRone (BUSPAR) 10 MG tablet; Take 1 tablet (10 mg total) by mouth 3 (three) times daily.  Dispense: 270 tablet; Refill: 2  3. Chronic bilateral low back pain without sciatica Recommend use of heating pad and Flexeril as needed at the end of his workday.  I have given some Naprosyn to use as needed.  We will try to avoid long-term use of narcotics. - cyclobenzaprine (FLEXERIL) 10 MG tablet; Take 1 tablet (10 mg total) by mouth daily as needed for muscle spasms.  Dispense: 30 tablet; Refill: 3 - naproxen (NAPROSYN) 500 MG tablet; Take 1 tablet (500 mg total) by mouth 2 (two) times daily with a meal.  Dispense: 40 tablet; Refill: 1  4. Diverticulosis of colon Sounds like patient had an episode of acute gastroenteritis with incidental finding of mild diverticulitis on  CAT scan.  Symptoms however have since resolved  5. Colon cancer screening - Ambulatory referral to Gastroenterology  Patient was given the opportunity to ask questions.  Patient verbalized understanding of the plan and was able to repeat key elements of the plan.   Orders Placed This Encounter  Procedures  . Ambulatory referral to Gastroenterology     Requested Prescriptions   Signed Prescriptions Disp Refills  . pravastatin (PRAVACHOL) 40 MG tablet 90 tablet 3    Sig: Take 1 tablet (40 mg total) by mouth daily.  . DULoxetine (CYMBALTA) 60 MG capsule 90 capsule 3    Sig: Take 1 capsule (  60 mg total) by mouth daily.  . hydrOXYzine (ATARAX/VISTARIL) 25 MG tablet 180 tablet 3    Sig: Take 1 tablet (25 mg total) by mouth every 6 (six) hours as needed for anxiety.  . traZODone (DESYREL) 150 MG tablet 90 tablet 3    Sig: Take 1 tablet (150 mg total) by mouth at bedtime as needed for sleep.  . cyclobenzaprine (FLEXERIL) 10 MG tablet 30 tablet 3    Sig: Take 1 tablet (10 mg total) by mouth daily as needed for muscle spasms.  . busPIRone (BUSPAR) 10 MG tablet 270 tablet 2    Sig: Take 1 tablet (10 mg total) by mouth 3 (three) times daily.  . naproxen (NAPROSYN) 500 MG tablet 40 tablet 1    Sig: Take 1 tablet (500 mg total) by mouth 2 (two) times daily with a meal.    Return in about 4 months (around 06/23/2018).  Jonah Blueeborah Avenell Sellers, MD, FACP

## 2018-06-24 ENCOUNTER — Ambulatory Visit: Payer: Self-pay | Admitting: Internal Medicine

## 2018-07-30 ENCOUNTER — Ambulatory Visit: Payer: Self-pay | Admitting: Internal Medicine

## 2018-09-13 ENCOUNTER — Ambulatory Visit: Payer: Self-pay | Admitting: Internal Medicine

## 2018-09-18 ENCOUNTER — Other Ambulatory Visit: Payer: Self-pay | Admitting: Internal Medicine

## 2018-09-18 DIAGNOSIS — M545 Low back pain: Principal | ICD-10-CM

## 2018-09-18 DIAGNOSIS — G8929 Other chronic pain: Secondary | ICD-10-CM

## 2018-09-20 ENCOUNTER — Ambulatory Visit: Payer: Self-pay | Admitting: Internal Medicine

## 2018-10-14 ENCOUNTER — Ambulatory Visit: Payer: Self-pay | Admitting: Internal Medicine

## 2018-12-13 ENCOUNTER — Other Ambulatory Visit: Payer: Self-pay | Admitting: Internal Medicine

## 2018-12-13 DIAGNOSIS — M545 Low back pain: Principal | ICD-10-CM

## 2018-12-13 DIAGNOSIS — G8929 Other chronic pain: Secondary | ICD-10-CM

## 2019-02-21 ENCOUNTER — Other Ambulatory Visit: Payer: Self-pay | Admitting: Internal Medicine

## 2019-02-21 DIAGNOSIS — F419 Anxiety disorder, unspecified: Secondary | ICD-10-CM

## 2019-02-21 DIAGNOSIS — F329 Major depressive disorder, single episode, unspecified: Secondary | ICD-10-CM

## 2019-03-21 ENCOUNTER — Other Ambulatory Visit: Payer: Self-pay | Admitting: Internal Medicine

## 2019-03-21 DIAGNOSIS — F329 Major depressive disorder, single episode, unspecified: Secondary | ICD-10-CM

## 2019-03-21 DIAGNOSIS — F419 Anxiety disorder, unspecified: Secondary | ICD-10-CM

## 2019-12-01 ENCOUNTER — Other Ambulatory Visit: Payer: Self-pay | Admitting: Internal Medicine

## 2019-12-01 DIAGNOSIS — E785 Hyperlipidemia, unspecified: Secondary | ICD-10-CM
